# Patient Record
Sex: Female | Born: 1965 | Race: White | Hispanic: No | Smoking: Current every day smoker
Health system: Southern US, Community
[De-identification: ages and names within clinical notes are randomized; demographics above are authoritative.]

## PROBLEM LIST (undated history)

## (undated) DIAGNOSIS — R87629 Unspecified abnormal cytological findings in specimens from vagina: Secondary | ICD-10-CM

## (undated) DIAGNOSIS — M1990 Unspecified osteoarthritis, unspecified site: Secondary | ICD-10-CM

## (undated) DIAGNOSIS — T8859XA Other complications of anesthesia, initial encounter: Secondary | ICD-10-CM

## (undated) DIAGNOSIS — K219 Gastro-esophageal reflux disease without esophagitis: Secondary | ICD-10-CM

## (undated) HISTORY — DX: Unspecified abnormal cytological findings in specimens from vagina: R87.629

## (undated) HISTORY — PX: KNEE SURGERY: SHX244

## (undated) HISTORY — PX: CRYOTHERAPY: SHX1416

---

## 1999-12-19 ENCOUNTER — Encounter: Admission: RE | Admit: 1999-12-19 | Discharge: 2000-03-18 | Payer: Self-pay | Admitting: Obstetrics and Gynecology

## 2001-01-28 ENCOUNTER — Other Ambulatory Visit: Admission: RE | Admit: 2001-01-28 | Discharge: 2001-01-28 | Payer: Self-pay | Admitting: Obstetrics and Gynecology

## 2011-01-23 ENCOUNTER — Other Ambulatory Visit: Payer: Self-pay | Admitting: Obstetrics and Gynecology

## 2011-01-23 DIAGNOSIS — Z139 Encounter for screening, unspecified: Secondary | ICD-10-CM

## 2011-01-25 ENCOUNTER — Ambulatory Visit (HOSPITAL_COMMUNITY)
Admission: RE | Admit: 2011-01-25 | Discharge: 2011-01-25 | Disposition: A | Discharge status: Home / Self Care | Payer: Self-pay | Source: Ambulatory Visit | Attending: Obstetrics and Gynecology | Admitting: Obstetrics and Gynecology

## 2011-01-25 DIAGNOSIS — Z139 Encounter for screening, unspecified: Secondary | ICD-10-CM

## 2011-11-02 ENCOUNTER — Other Ambulatory Visit: Payer: Self-pay

## 2011-11-05 ENCOUNTER — Other Ambulatory Visit: Payer: Self-pay

## 2011-11-06 NOTE — Telephone Encounter (Signed)
Second denial. Not sure why we are getting request on this patient for Mobic. Please call pharmacy. I don't see where she is an active patient of ours anyway.

## 2011-11-08 NOTE — Telephone Encounter (Signed)
Ok I will call them. Thanks

## 2011-11-12 ENCOUNTER — Other Ambulatory Visit: Payer: Self-pay

## 2011-11-19 NOTE — Telephone Encounter (Signed)
Error

## 2012-07-25 ENCOUNTER — Emergency Department (HOSPITAL_COMMUNITY)
Admission: EM | Admit: 2012-07-25 | Discharge: 2012-07-25 | Disposition: A | Discharge status: Home / Self Care | Payer: Medicaid Other | Attending: Emergency Medicine | Admitting: Emergency Medicine

## 2012-07-25 ENCOUNTER — Encounter (HOSPITAL_COMMUNITY): Payer: Self-pay | Admitting: *Deleted

## 2012-07-25 ENCOUNTER — Encounter (HOSPITAL_COMMUNITY): Payer: Self-pay | Admitting: Anesthesiology

## 2012-07-25 ENCOUNTER — Encounter (HOSPITAL_COMMUNITY)
Admission: EM | Disposition: A | Discharge status: Home / Self Care | Payer: Self-pay | Source: Home / Self Care | Attending: Emergency Medicine

## 2012-07-25 ENCOUNTER — Emergency Department (HOSPITAL_COMMUNITY): Payer: Medicaid Other | Admitting: Anesthesiology

## 2012-07-25 ENCOUNTER — Emergency Department (HOSPITAL_COMMUNITY): Payer: Medicaid Other

## 2012-07-25 ENCOUNTER — Emergency Department (HOSPITAL_COMMUNITY)
Admission: EM | Admit: 2012-07-25 | Discharge: 2012-07-25 | Disposition: A | Discharge status: Home / Self Care | Payer: Medicaid Other | Source: Home / Self Care | Attending: Emergency Medicine | Admitting: Emergency Medicine

## 2012-07-25 DIAGNOSIS — Y9289 Other specified places as the place of occurrence of the external cause: Secondary | ICD-10-CM | POA: Insufficient documentation

## 2012-07-25 DIAGNOSIS — IMO0002 Reserved for concepts with insufficient information to code with codable children: Secondary | ICD-10-CM | POA: Insufficient documentation

## 2012-07-25 DIAGNOSIS — S62629B Displaced fracture of medial phalanx of unspecified finger, initial encounter for open fracture: Secondary | ICD-10-CM

## 2012-07-25 DIAGNOSIS — W3189XA Contact with other specified machinery, initial encounter: Secondary | ICD-10-CM | POA: Insufficient documentation

## 2012-07-25 DIAGNOSIS — W230XXA Caught, crushed, jammed, or pinched between moving objects, initial encounter: Secondary | ICD-10-CM | POA: Insufficient documentation

## 2012-07-25 DIAGNOSIS — Y9389 Activity, other specified: Secondary | ICD-10-CM | POA: Insufficient documentation

## 2012-07-25 DIAGNOSIS — S6990XA Unspecified injury of unspecified wrist, hand and finger(s), initial encounter: Secondary | ICD-10-CM

## 2012-07-25 DIAGNOSIS — J4489 Other specified chronic obstructive pulmonary disease: Secondary | ICD-10-CM | POA: Insufficient documentation

## 2012-07-25 DIAGNOSIS — Z9889 Other specified postprocedural states: Secondary | ICD-10-CM | POA: Insufficient documentation

## 2012-07-25 DIAGNOSIS — J449 Chronic obstructive pulmonary disease, unspecified: Secondary | ICD-10-CM | POA: Insufficient documentation

## 2012-07-25 DIAGNOSIS — F17200 Nicotine dependence, unspecified, uncomplicated: Secondary | ICD-10-CM | POA: Insufficient documentation

## 2012-07-25 HISTORY — PX: OPEN REDUCTION INTERNAL FIXATION (ORIF) METACARPAL: SHX6234

## 2012-07-25 LAB — CBC
HCT: 42.6 % (ref 36.0–46.0)
Hemoglobin: 14.7 g/dL (ref 12.0–15.0)
MCH: 31.6 pg (ref 26.0–34.0)
MCV: 91.6 fL (ref 78.0–100.0)
RBC: 4.65 MIL/uL (ref 3.87–5.11)

## 2012-07-25 LAB — BASIC METABOLIC PANEL
CO2: 24 mEq/L (ref 19–32)
Calcium: 9.4 mg/dL (ref 8.4–10.5)
Glucose, Bld: 97 mg/dL (ref 70–99)
Sodium: 137 mEq/L (ref 135–145)

## 2012-07-25 SURGERY — OPEN REDUCTION INTERNAL FIXATION (ORIF) METACARPAL
Anesthesia: General | Site: Finger | Laterality: Right | Wound class: Contaminated

## 2012-07-25 MED ORDER — SUCCINYLCHOLINE CHLORIDE 20 MG/ML IJ SOLN
INTRAMUSCULAR | Status: DC | PRN
  Administered 2012-07-25: 120 mg via INTRAVENOUS

## 2012-07-25 MED ORDER — DOCUSATE SODIUM 100 MG PO CAPS: 100.0000 mg | ORAL_CAPSULE | Freq: Two times a day (BID) | ORAL | Status: DC

## 2012-07-25 MED ORDER — BUPIVACAINE HCL (PF) 0.25 % IJ SOLN
INTRAMUSCULAR | Status: AC
  Filled 2012-07-25: qty 30

## 2012-07-25 MED ORDER — BUPIVACAINE HCL (PF) 0.25 % IJ SOLN
INTRAMUSCULAR | Status: DC | PRN
  Administered 2012-07-25: 8 mL

## 2012-07-25 MED ORDER — CHLORHEXIDINE GLUCONATE 4 % EX LIQD
60.0000 mL | Freq: Once | CUTANEOUS | Status: DC
  Filled 2012-07-25: qty 60

## 2012-07-25 MED ORDER — CEFAZOLIN SODIUM-DEXTROSE 2-3 GM-% IV SOLR
2.0000 g | INTRAVENOUS | Status: AC
  Administered 2012-07-25: 2 g via INTRAVENOUS
  Filled 2012-07-25: qty 50

## 2012-07-25 MED ORDER — KCL IN DEXTROSE-NACL 20-5-0.45 MEQ/L-%-% IV SOLN
INTRAVENOUS | Status: DC
  Administered 2012-07-25: 16:00:00 via INTRAVENOUS
  Filled 2012-07-25: qty 1000

## 2012-07-25 MED ORDER — ONDANSETRON HCL 4 MG/2ML IJ SOLN
INTRAMUSCULAR | Status: DC | PRN
  Administered 2012-07-25: 4 mg via INTRAVENOUS

## 2012-07-25 MED ORDER — LACTATED RINGERS IV SOLN
INTRAVENOUS | Status: DC | PRN
  Administered 2012-07-25 (×2): via INTRAVENOUS

## 2012-07-25 MED ORDER — OXYCODONE HCL 5 MG PO TABS: 5.0000 mg | ORAL_TABLET | Freq: Once | ORAL | Status: DC | PRN

## 2012-07-25 MED ORDER — MIDAZOLAM HCL 5 MG/5ML IJ SOLN
INTRAMUSCULAR | Status: DC | PRN
  Administered 2012-07-25 (×2): 1 mg via INTRAVENOUS

## 2012-07-25 MED ORDER — MORPHINE SULFATE 4 MG/ML IJ SOLN
4.0000 mg | Freq: Once | INTRAMUSCULAR | Status: DC
  Filled 2012-07-25: qty 1

## 2012-07-25 MED ORDER — LIDOCAINE HCL (CARDIAC) 20 MG/ML IV SOLN
INTRAVENOUS | Status: DC | PRN
  Administered 2012-07-25: 100 mg via INTRAVENOUS

## 2012-07-25 MED ORDER — CEFAZOLIN SODIUM 1-5 GM-% IV SOLN
INTRAVENOUS | Status: DC | PRN
  Administered 2012-07-25: 1 g via INTRAVENOUS

## 2012-07-25 MED ORDER — PROPOFOL 10 MG/ML IV BOLUS
INTRAVENOUS | Status: DC | PRN
  Administered 2012-07-25: 140 mg via INTRAVENOUS

## 2012-07-25 MED ORDER — OXYCODONE-ACETAMINOPHEN 5-325 MG PO TABS: 1.0000 | ORAL_TABLET | ORAL | Status: DC | PRN

## 2012-07-25 MED ORDER — 0.9 % SODIUM CHLORIDE (POUR BTL) OPTIME
TOPICAL | Status: DC | PRN
  Administered 2012-07-25: 1000 mL

## 2012-07-25 MED ORDER — FENTANYL CITRATE 0.05 MG/ML IJ SOLN
INTRAMUSCULAR | Status: DC | PRN
  Administered 2012-07-25 (×3): 50 ug via INTRAVENOUS
  Administered 2012-07-25: 150 ug via INTRAVENOUS
  Administered 2012-07-25: 100 ug via INTRAVENOUS

## 2012-07-25 MED ORDER — HYDROMORPHONE HCL PF 1 MG/ML IJ SOLN: 0.2500 mg | INTRAMUSCULAR | Status: DC | PRN

## 2012-07-25 MED ORDER — LIDOCAINE HCL (PF) 2 % IJ SOLN
INTRAMUSCULAR | Status: AC
  Administered 2012-07-25: 12:00:00
  Filled 2012-07-25: qty 10

## 2012-07-25 MED ORDER — PROMETHAZINE HCL 25 MG/ML IJ SOLN: 6.2500 mg | INTRAMUSCULAR | Status: DC | PRN

## 2012-07-25 MED ORDER — OXYCODONE HCL 5 MG/5ML PO SOLN: 5.0000 mg | Freq: Once | ORAL | Status: DC | PRN

## 2012-07-25 SURGICAL SUPPLY — 55 items
BANDAGE CONFORM 2  STR LF (GAUZE/BANDAGES/DRESSINGS) ×1 IMPLANT
BANDAGE ELASTIC 3 VELCRO ST LF (GAUZE/BANDAGES/DRESSINGS) ×2 IMPLANT
BANDAGE ELASTIC 4 VELCRO ST LF (GAUZE/BANDAGES/DRESSINGS) ×2 IMPLANT
BANDAGE GAUZE ELAST BULKY 4 IN (GAUZE/BANDAGES/DRESSINGS) ×2 IMPLANT
BLADE SURG ROTATE 9660 (MISCELLANEOUS) IMPLANT
BNDG CMPR 9X4 STRL LF SNTH (GAUZE/BANDAGES/DRESSINGS) ×1
BNDG CMPR MD 5X2 ELC HKLP STRL (GAUZE/BANDAGES/DRESSINGS) ×1
BNDG ELASTIC 2 VLCR STRL LF (GAUZE/BANDAGES/DRESSINGS) ×1 IMPLANT
BNDG ESMARK 4X9 LF (GAUZE/BANDAGES/DRESSINGS) ×2 IMPLANT
CLOTH BEACON ORANGE TIMEOUT ST (SAFETY) ×2 IMPLANT
CORDS BIPOLAR (ELECTRODE) ×2 IMPLANT
COVER SURGICAL LIGHT HANDLE (MISCELLANEOUS) ×2 IMPLANT
CUFF TOURNIQUET SINGLE 18IN (TOURNIQUET CUFF) ×2 IMPLANT
CUFF TOURNIQUET SINGLE 24IN (TOURNIQUET CUFF) IMPLANT
DRAIN TLS ROUND 10FR (DRAIN) IMPLANT
DRAPE OEC MINIVIEW 54X84 (DRAPES) ×2 IMPLANT
DRAPE SURG 17X11 SM STRL (DRAPES) ×2 IMPLANT
DRSG ADAPTIC 3X8 NADH LF (GAUZE/BANDAGES/DRESSINGS) ×2 IMPLANT
ELECT REM PT RETURN 9FT ADLT (ELECTROSURGICAL)
ELECTRODE REM PT RTRN 9FT ADLT (ELECTROSURGICAL) IMPLANT
GAUZE SPONGE 4X4 16PLY XRAY LF (GAUZE/BANDAGES/DRESSINGS) ×2 IMPLANT
GAUZE XEROFORM 1X8 LF (GAUZE/BANDAGES/DRESSINGS) ×1 IMPLANT
GLOVE BIOGEL PI IND STRL 8.5 (GLOVE) ×1 IMPLANT
GLOVE BIOGEL PI INDICATOR 8.5 (GLOVE) ×1
GLOVE SURG ORTHO 8.0 STRL STRW (GLOVE) ×2 IMPLANT
GOWN PREVENTION PLUS XLARGE (GOWN DISPOSABLE) ×2 IMPLANT
GOWN STRL NON-REIN LRG LVL3 (GOWN DISPOSABLE) ×6 IMPLANT
KIT BASIN OR (CUSTOM PROCEDURE TRAY) ×2 IMPLANT
KIT ROOM TURNOVER OR (KITS) ×2 IMPLANT
KWIRE 4.0 X .035IN (WIRE) ×2 IMPLANT
MANIFOLD NEPTUNE II (INSTRUMENTS) ×2 IMPLANT
NDL HYPO 25X1 1.5 SAFETY (NEEDLE) ×1 IMPLANT
NEEDLE HYPO 25X1 1.5 SAFETY (NEEDLE) ×2 IMPLANT
NS IRRIG 1000ML POUR BTL (IV SOLUTION) ×2 IMPLANT
PACK ORTHO EXTREMITY (CUSTOM PROCEDURE TRAY) ×2 IMPLANT
PAD ARMBOARD 7.5X6 YLW CONV (MISCELLANEOUS) ×4 IMPLANT
PAD CAST 4YDX4 CTTN HI CHSV (CAST SUPPLIES) ×1 IMPLANT
PADDING CAST COTTON 4X4 STRL (CAST SUPPLIES) ×2
PADDING UNDERCAST 2  STERILE (CAST SUPPLIES) ×1 IMPLANT
SOAP 2 % CHG 4 OZ (WOUND CARE) ×2 IMPLANT
SPONGE GAUZE 4X4 12PLY (GAUZE/BANDAGES/DRESSINGS) ×2 IMPLANT
STRIP CLOSURE SKIN 1/2X4 (GAUZE/BANDAGES/DRESSINGS) IMPLANT
SUCTION FRAZIER TIP 10 FR DISP (SUCTIONS) ×2 IMPLANT
SUT ETHILON 4 0 PS 2 18 (SUTURE) ×2 IMPLANT
SUT MNCRL AB 4-0 PS2 18 (SUTURE) IMPLANT
SUT PROLENE 5 0 PS 2 (SUTURE) ×1 IMPLANT
SUT VIC AB 2-0 FS1 27 (SUTURE) ×2 IMPLANT
SUT VICRYL 4-0 PS2 18IN ABS (SUTURE) ×2 IMPLANT
SYR CONTROL 10ML LL (SYRINGE) IMPLANT
SYSTEM CHEST DRAIN TLS 7FR (DRAIN) IMPLANT
TOWEL OR 17X24 6PK STRL BLUE (TOWEL DISPOSABLE) ×2 IMPLANT
TOWEL OR 17X26 10 PK STRL BLUE (TOWEL DISPOSABLE) ×2 IMPLANT
TUBE CONNECTING 12X1/4 (SUCTIONS) ×2 IMPLANT
WATER STERILE IRR 1000ML POUR (IV SOLUTION) ×2 IMPLANT
YANKAUER SUCT BULB TIP NO VENT (SUCTIONS) IMPLANT

## 2012-07-25 NOTE — Anesthesia Postprocedure Evaluation (Signed)
Anesthesia Post Note  Patient: Julia Bean  Procedure(s) Performed: Procedure(s) (LRB): OPEN REDUCTION INTERNAL FIXATION (ORIF) METACARPAL (Right)  Anesthesia type: general  Patient location: PACU  Post pain: Pain level controlled  Post assessment: Patient's Cardiovascular Status Stable  Last Vitals:  Filed Vitals:   07/25/12 2015  BP:   Pulse: 74  Temp:   Resp: 17    Post vital signs: Reviewed and stable  Level of consciousness: sedated  Complications: No apparent anesthesia complications

## 2012-07-25 NOTE — Anesthesia Preprocedure Evaluation (Addendum)
Anesthesia Evaluation  Patient identified by MRN, date of birth, ID band Patient awake    Reviewed: Allergy & Precautions, H&P , NPO status , Patient's Chart, lab work & pertinent test results  Airway Mallampati: I TM Distance: >3 FB Neck ROM: Full    Dental  (+) Teeth Intact and Dental Advisory Given   Pulmonary COPDCurrent Smoker,  + rhonchi         Cardiovascular Rhythm:Regular Rate:Normal     Neuro/Psych    GI/Hepatic   Endo/Other    Renal/GU      Musculoskeletal   Abdominal   Peds  Hematology   Anesthesia Other Findings   Reproductive/Obstetrics                         Anesthesia Physical Anesthesia Plan  ASA: II and emergent  Anesthesia Plan: General   Post-op Pain Management:    Induction: Intravenous, Rapid sequence and Cricoid pressure planned  Airway Management Planned: Oral ETT  Additional Equipment:   Intra-op Plan:   Post-operative Plan: Extubation in OR  Informed Consent: I have reviewed the patients History and Physical, chart, labs and discussed the procedure including the risks, benefits and alternatives for the proposed anesthesia with the patient or authorized representative who has indicated his/her understanding and acceptance.   Dental advisory given  Plan Discussed with: CRNA and Surgeon  Anesthesia Plan Comments:       Anesthesia Quick Evaluation

## 2012-07-25 NOTE — Brief Op Note (Signed)
07/25/2012  5:58 PM  PATIENT:  Bartholomew Crews  46 y.o. female  PRE-OPERATIVE DIAGNOSIS:  Right Ring Finger Fracture  POST-OPERATIVE DIAGNOSIS:  RIGHT RING FINGER FRACTURE  PROCEDURE:  orif right ring finger middle phalanx Debridement right ring finger  SURGEON:  Surgeon(s) and Role:    * [name], MD - Primary  PHYSICIAN ASSISTANT: NONE ASSISTANTS: none   ANESTHESIA:   general  EBL:   none  BLOOD ADMINISTERED:none  DRAINS: none   LOCAL MEDICATIONS USED:  MARCAINE     SPECIMEN:  No Specimen  DISPOSITION OF SPECIMEN:  N/A  COUNTS:  YES  TOURNIQUET:  * Missing tourniquet times found for documented tourniquets in log:  16109 *  DICTATION: .[id number]  PLAN OF CARE: Discharge to home after PACU  PATIENT DISPOSITION:  PACU - hemodynamically stable.   Delay start of Pharmacological VTE agent (>24hrs) due to surgical blood loss or risk of bleeding: not applicable

## 2012-07-25 NOTE — ED Notes (Addendum)
Laceration to rtring finger, hand was smashed with a piece of wood while using a wood splitter.  Pain also to rt 5th digit.  Ice pack applied

## 2012-07-25 NOTE — ED Notes (Signed)
Received call from OR - hand surgeon is on his way from his office to see pt.  Pt informed of this , incentive spirometer teaching performed, pt   awaiting to use bathroom .

## 2012-07-25 NOTE — H&P (Addendum)
Julia Bean is an 46 y.o. female.   Chief Complaint: RIGHT ring finger injury HPI: PT SEEN AND EXAMINED AT [place], PT WITH OPEN LEFT RING FINGER FRACTURE FROM LOG SPLITTER PT TRANSFERRED TO [facility] FOR DEFINITIVE CARE PT WITH NO PRIOR INJURY TO RING FINGER PT C/O PAIN TO RIGHT RING FINGER HAS NOT RECEIVED PAIN MEDICATION  History reviewed. No pertinent past medical history.  Past Surgical History  Procedure Date  . Knee surgery   . Cesarean section     History reviewed. No pertinent family history. Social History:  reports that she has been smoking.  She does not have any smokeless tobacco history on file. She reports that she drinks alcohol. She reports that she does not use illicit drugs.  Allergies: No Known Allergies   (Not in a hospital admission)  Results for orders placed during the hospital encounter of 07/25/12 (from the past 48 hour(s))  CBC     Status: Abnormal   Collection Time   07/25/12  2:57 PM      Component Value Range Comment   WBC 11.0 (*) 4.0 - 10.5 K/uL    RBC 4.65  3.87 - 5.11 MIL/uL    Hemoglobin 14.7  12.0 - 15.0 g/dL    HCT 19.1  47.8 - 29.5 %    MCV 91.6  78.0 - 100.0 fL    MCH 31.6  26.0 - 34.0 pg    MCHC 34.5  30.0 - 36.0 g/dL    RDW 62.1  30.8 - 65.7 %    Platelets 219  150 - 400 K/uL   BASIC METABOLIC PANEL     Status: Normal   Collection Time   07/25/12  2:57 PM      Component Value Range Comment   Sodium 137  135 - 145 mEq/L    Potassium 3.6  3.5 - 5.1 mEq/L    Chloride 101  96 - 112 mEq/L    CO2 24  19 - 32 mEq/L    Glucose, Bld 97  70 - 99 mg/dL    BUN 12  6 - 23 mg/dL    Creatinine, Ser 8.46  0.50 - 1.10 mg/dL    Calcium 9.4  8.4 - 96.2 mg/dL    GFR calc non Af Amer >90  >90 mL/min    GFR calc Af Amer >90  >90 mL/min    Dg Hand Complete Right  07/25/2012  *RADIOLOGY REPORT*  Clinical Data: Finger versus wood splinter  RIGHT HAND - COMPLETE 3+ VIEW  Comparison: None.  Findings: Transverse fracture across the middle phalanx  right ring finger, distracted 2-3 mm.  There is gas in the fracture and adjacent subcutaneous tissues indicating open fracture.  There is dorsal angulation of the distal fracture fragment.  No other bony abnormality is evident.  IMPRESSION:  1.  Open fracture, middle phalanx right ring finger   Original Report Authenticated By: D. Andria Rhein, MD     NO RECENT ILLNESSES  OR HOSPITALIZATIONS  Blood pressure 116/62, pulse 80, temperature 98.2 F (36.8 C), temperature source Oral, resp. rate 18, last menstrual period 07/18/2012, SpO2 98.00%.  General Appearance:  Alert, cooperative, no distress, appears stated age  Head:  Normocephalic, without obvious abnormality, atraumatic  Eyes:  Pupils equal, conjunctiva/corneas clear,         Throat: Lips, mucosa, and tongue normal; teeth and gums normal  Neck: No visible masses     Lungs:   respirations unlabored  Chest Wall:  No tenderness or deformity  Heart:  Regular rate and rhythm,  Abdomen:   Soft, non-tender,         Extremities: RIGHT RING FINGER PERFUSED, LIMITED MOBILITY ABLE TO GENTLY WIGGLE FINGER OBVIOUS DEFORMITY TO FINGER NO INJURY TO INDEX/LONG GOOD THUMB MOBILITY  Pulses: 2+ and symmetric  Skin: Skin color, texture, turgor normal, no rashes or lesions     Neurologic: Normal    Assessment/Plan RIGHT RING FINGER OPEN MIDDLE PHALANX FRACTURE  RIGHT RING FINGER OPEN REDUCTION AND INTERNAL FIXATION, IRRIGATION AND DEBRIDEMENT, REPAIR AS INDICATED  PT SEEN AND EVALUATED IN ED PT WITH OPEN PHALANGEAL FRACTURE THAT IS UNSTABLE WILL PLAN FOR OPERATIVE DEBRIDEMENT AND FIXATION  R/B/A DISCUSSED WITH PT IN ED.  PT VOICED UNDERSTANDING OF PLAN CONSENT SIGNED DAY OF SURGERY PT SEEN AND EXAMINED PRIOR TO OPERATIVE PROCEDURE/DAY OF SURGERY SITE MARKED. QUESTIONS ANSWERED WILL GO HOME FOLLOWING SURGERY  [name] 07/25/2012, 5:52 PM

## 2012-07-25 NOTE — ED Notes (Addendum)
IV line flushing with NS at this time 

## 2012-07-25 NOTE — ED Notes (Signed)
Explanation given to pt and friend at bedside that the Hand surgeon may not be in until after office hours. Pt verbalized displeasure but is understanding of situation. Declined morphine and informed on inability to administer po meds or any kind of anti-inflammatory meds via IV.

## 2012-07-25 NOTE — Transfer of Care (Signed)
Immediate Anesthesia Transfer of Care Note  Patient: Julia Bean  Procedure(s) Performed: Procedure(s) (LRB) with comments: OPEN REDUCTION INTERNAL FIXATION (ORIF) METACARPAL (Right) - Open Reduction Internal Fixation Right Third Finger  Patient Location: PACU  Anesthesia Type:General  Level of Consciousness: awake and alert   Airway & Oxygen Therapy: Patient Spontanous Breathing  Post-op Assessment: Report given to PACU RN and Post -op Vital signs reviewed and stable  Post vital signs: Reviewed and stable  Complications: No apparent anesthesia complications

## 2012-07-25 NOTE — ED Notes (Signed)
Pt has hand injury due to see Dr. Orlan Leavens.  Pt A/O x 4.  NAD.  Pt has lac to ring finger and pinky finger-reports that her ring finger is broken.  Pt from [facility].

## 2012-07-25 NOTE — ED Notes (Signed)
Spoke with Dr Orlan Leavens.

## 2012-07-25 NOTE — ED Provider Notes (Signed)
History     CSN: [id number]  Arrival date & time 07/25/12  1036   First MD Initiated Contact with Patient 07/25/12 1044      Chief Complaint  Patient presents with  . Finger Injury    (Consider location/radiation/quality/duration/timing/severity/associated sxs/prior treatment) HPI Comments: Julia Bean presents with a crush injury to her 4th and 5th fingers of her right hand after catching them in between the splitter and a log while using a log splitter just prior to arrival.  She does have sensation in the fingers, along with significant swelling,  Tenderness and small amount of bleeding from the lacerations present.  She has taken no medications prior to arrival and no treatment other than direct pressure to the wound.  Her last tetanus was less than 5 years ago.  She is right handed and has no other significant medical history.    The history is provided by the patient and a friend.    History reviewed. No pertinent past medical history.  Past Surgical History  Procedure Date  . Knee surgery   . Cesarean section     No family history on file.  History  Substance Use Topics  . Smoking status: Current Every Day Smoker  . Smokeless tobacco: Not on file  . Alcohol Use: Yes     Comment: Occ    OB History    Grav Para Term Preterm Abortions TAB SAB Ect Mult Living                  Review of Systems  Musculoskeletal: Positive for joint swelling and arthralgias.  Skin: Positive for wound.  Neurological: Negative for weakness and numbness.    Allergies  Review of patient's allergies indicates no known allergies.  Home Medications   Current Outpatient Rx  Name  Route  Sig  Dispense  Refill  . THERA M PLUS PO TABS   Oral   Take 1 tablet by mouth daily.         Marland Kitchen DOCUSATE SODIUM 100 MG PO CAPS   Oral   Take 1 capsule (100 mg total) by mouth 2 (two) times daily.   30 capsule   0   . OXYCODONE-ACETAMINOPHEN 5-325 MG PO TABS   Oral   Take 1 tablet by  mouth every 4 (four) hours as needed for pain.   45 tablet   0     BP 98/55  Pulse 65  Temp 97.7 F (36.5 C) (Oral)  Resp 16  SpO2 100%  LMP 07/18/2012  Physical Exam  Constitutional: She appears well-developed and well-nourished.  HENT:  Head: Atraumatic.  Neck: Neck supple.  Cardiovascular: Normal rate.        Pulses equal bilaterally  Pulmonary/Chest: Effort normal.  Musculoskeletal: She exhibits edema and tenderness.       Right hand: She exhibits decreased range of motion, tenderness, deformity, laceration and swelling. She exhibits normal capillary refill. normal sensation noted.       Finger strength not assessed secondary to pain and obvious deformity.  She has 2 lacerations, one along vertical length of distal and middle phalanx,  Second on 4th finger tuft,  Hemostatic.  Moderate edema and ecchymosis noted primarily of 4th finger.  She can wiggle the finger tip,  With discomfort.    Neurological: She is alert. She has normal strength. She displays normal reflexes. No sensory deficit.       Equal strength  Skin: Skin is warm and dry.  Psychiatric: She  has a normal mood and affect.    ED Course  NERVE BLOCK Date/Time: 07/25/2012 11:40 AM Performed by: Burgess Amor Authorized by: Burgess Amor Consent: Verbal consent obtained. Risks and benefits: risks, benefits and alternatives were discussed Consent given by: patient Patient understanding: patient states understanding of the procedure being performed Patient identity confirmed: verbally with patient Time out: Immediately prior to procedure a "time out" was called to verify the correct patient, procedure, equipment, support staff and site/side marked as required. Indications: pain relief and fracture Body area: upper extremity Nerve: digital Laterality: right Preparation: Patient was prepped and draped in the usual sterile fashion. Patient position: supine Needle gauge: 25 G Location technique: anatomical  landmarks Local anesthetic: lidocaine 2% without epinephrine Anesthetic total: 2 ml Outcome: pain improved Patient tolerance: Patient tolerated the procedure well with no immediate complications. Comments: Additionally,  Ring was cut from this finger carefully, using manual ring cutters.   (including critical care time)  Labs Reviewed - No data to display Dg Hand Complete Right  07/25/2012  *RADIOLOGY REPORT*  Clinical Data: Finger versus wood splinter  RIGHT HAND - COMPLETE 3+ VIEW  Comparison: None.  Findings: Transverse fracture across the middle phalanx right ring finger, distracted 2-3 mm.  There is gas in the fracture and adjacent subcutaneous tissues indicating open fracture.  There is dorsal angulation of the distal fracture fragment.  No other bony abnormality is evident.  IMPRESSION:  1.  Open fracture, middle phalanx right ring finger   Original Report Authenticated By: D. Andria Rhein, MD      1. Fracture of finger, middle phalanx, right, open       MDM  Call placed to Dr Romeo Apple who defers pt to hand specialist.  Call placed to Dr. Melvyn Novas who will see this pt at [facility] ed.  Pt's finger was dressed in 4 x 4's moistened with NS,  Cling.  Advised npo.  Friend at bedside will transport pt directly to [place].  I contacted [facility] triage and informed of patients pending arrival.    Patients labs and/or radiological studies were reviewed during the medical decision making and disposition process.     Burgess Amor, [place] 07/25/12 2315

## 2012-07-25 NOTE — Progress Notes (Signed)
Patient discharged to home with family.  Discharge teaching completed with husband, patient and children.  IV discontinued (catheter intact) before patient was discharged

## 2012-07-25 NOTE — Preoperative (Signed)
Beta Blockers   Reason not to administer Beta Blockers:Not Applicable 

## 2012-07-25 NOTE — ED Notes (Addendum)
Lac to rt ring finger , has ring on finger that pt will not let me try to remove.  Ring is loose at present. Pain 5 th finger also

## 2012-07-25 NOTE — ED Notes (Signed)
Dr Orlan Leavens in room with pt , Consent signed. Instructed to sent pt directly to OR ,

## 2012-07-26 NOTE — Op Note (Signed)
Julia Bean              ACCOUNT NO.:  [id number]  MEDICAL RECORD NO.:  [id number]  LOCATION:  [facility]                         FACILITY:  [facility]  PHYSICIAN:  Julia Done, MD  DATE OF BIRTH:  [birth year]  DATE OF PROCEDURE:  07/25/2012 DATE OF DISCHARGE:  07/25/2012                              OPERATIVE REPORT   PREOPERATIVE DIAGNOSIS:  Right ring finger displaced middle phalanx fracture, crush injury.  POSTOPERATIVE DIAGNOSIS:  Right ring finger displaced middle phalanx fracture, crush injury.  ATTENDING PHYSICIAN:  Julia Done, MD who scrubbed and present for the entire procedure.  ASSISTANT SURGEON:  None.  ANESTHESIA:  General via endotracheal tube.  OPERATION: 1. Debridement of skin and subcutaneous tissue associated with open     fracture, right ring finger. 2. Percutaneous skeletal fixation for unstable phalangeal shaft     fracture, right ring finger, middle phalanx. 3. Radiographs 3 views, right ring finger.  SURGICAL IMPLANTS:  Two 0.035 K-wires.  SURGICAL INDICATIONS:  Julia Bean is a right-hand-dominant female who sustained a crushing injury to her right ring finger.  The patient presented to the ER and was recommended to undergo the above procedure. Risks, benefits, and alternative were discussed in detail with the patient and signed informed consent was obtained.  Risks include, but not limited to bleeding, infection, damage to nearby nerves, arteries, or tendons, loss of motion of wrist and digits, incomplete relief of symptoms, and need for further surgical intervention.  DESCRIPTION OF PROCEDURE:  The patient was properly identified in the preop holding area and mark with a permanent marker made on the right ring finger to indicate correct operative site.  The patient was then brought back to the operating room, placed supine on the anesthesia room table.  General anesthesia was administered.  The patient received preop antibiotics  prior to skin incision.  A well-padded tourniquet was then placed on the right brachium and sealed with 1000 drape.  The right upper extremity was then prepped and draped in normal sterile fashion. Time-out was called, correct side was identified, and procedure then begun.  Attention was then turned to the right ring finger where the injury was more at the distal pulp over the distal phalanx.  Debridement of the skin, subcutaneous tissue was then carried out.  Excisional debridement was then carried out.  The patient did have a near circumferential laceration over the distal phalanx.  She did have a very small, less than 0.5 mm focal over the dorsal aspect of the middle phalanx.  Following this, debridement of skin and subcutaneous tissue was then carried out sharply with a sharp knife and scissors.  After excisional debridement, the wound was then thoroughly irrigated. Following this, given the soft tissue injury we tried to not violate the extensor mechanism.  Percutaneous skeletal fixation was then carried out on the middle phalanx fracture.  Two 0.035 K-wires were then placed across the fracture site keeping the finger in good alignment with good cortical contact.  There was still slight very mild dorsal displacement after the pinning.  K-wires were felt to be in good position, cut and then bent left out of the skin.  Pin sites  were then irrigated. Xeroform bolster dressing was applied around the pin sites.  Copious wound irrigation Bean throughout.  The distal laceration was then closed with simple Prolene sutures.  Final radiographs were then obtained.  10 mL of 0.25% Marcaine infiltrated.  Adaptic dressing and sterile compressive bandage then applied.  The patient was then placed in a well- padded ulnar gutter splint, extubated, and taken to the recovery room in good condition.  POSTPROCEDURE PLAN:  The patient will be discharged to home, seen back in the office in approximately 1  week for wound check, and then x-rays, application of a short-arm ulnar gutter cast for a total of 3-1/2 weeks until the pins out around the 3-1/2 to 4-week mark and then begin an outpatient therapy regimen.  Radiographs at each visit.  INTRAOPERATIVE RADIOGRAPHS:  Three views of the finger do show the internal fixation in place in good position.     Julia Done, MD     FWO/MEDQ  D:  07/25/2012  T:  07/26/2012  Job:  [id number]

## 2012-07-27 NOTE — ED Provider Notes (Signed)
Medical screening examination/treatment/procedure(s) were conducted as a shared visit with non-physician practitioner(Julie Idol) and myself.  I personally evaluated the patient during the encounter.  The patient presents after injuring finger with a log splitter.    On exam, the vitals are stable and she is afebrile.  There is swelling of the middle phalanx of the right ring finger with lacerations to either side.    She has a mid-shaft middle phalanx fracture with lacerations overlying the fracture site.  Julia Bean has spoken with hand surgery and she will be sent [facility] ED where she will be evaluated.  A digital block has been performed.  Geoffery Lyons, MD 07/27/12 [phone]

## 2012-07-28 ENCOUNTER — Encounter (HOSPITAL_COMMUNITY): Payer: Self-pay | Admitting: Orthopedic Surgery

## 2012-08-11 NOTE — ED Provider Notes (Signed)
History     CSN: [id number]  Arrival date & time 07/25/12  1355   First MD Initiated Contact with Patient 07/25/12 1442      Chief Complaint  Patient presents with  . Hand Injury    (Consider location/radiation/quality/duration/timing/severity/associated sxs/prior treatment) Patient is a 47 y.o. female presenting with hand injury. The history is provided by the patient.  Hand Injury  The incident occurred 3 to 5 hours ago. Injury mechanism: Caught fingers in equipment while splitting logs.  The pain is present in the right fingers. The pain is mild. Pertinent negatives include no fever. Associated symptoms comments: She was seen at [facility] and referred to Dr. Melvyn Novas here for surgery. Marland Kitchen    History reviewed. No pertinent past medical history.  Past Surgical History  Procedure Date  . Knee surgery   . Cesarean section   . Open reduction internal fixation (orif) metacarpal 07/25/2012    Procedure: OPEN REDUCTION INTERNAL FIXATION (ORIF) METACARPAL;  Surgeon: [name], MD;  Location: [facility] OR;  Service: Orthopedics;  Laterality: Right;  Open Reduction Internal Fixation Right Third Finger    History reviewed. No pertinent family history.  History  Substance Use Topics  . Smoking status: Current Every Day Smoker  . Smokeless tobacco: Not on file  . Alcohol Use: Yes     Comment: Occ    OB History    Grav Para Term Preterm Abortions TAB SAB Ect Mult Living                  Review of Systems  Constitutional: Negative for fever and chills.  Musculoskeletal:       See HPI.  Skin: Positive for wound.  Neurological: Negative.  Negative for numbness.    Allergies  Review of patient's allergies indicates no known allergies.  Home Medications   Current Outpatient Rx  Name  Route  Sig  Dispense  Refill  . THERA M PLUS PO TABS   Oral   Take 1 tablet by mouth daily.         Marland Kitchen DOCUSATE SODIUM 100 MG PO CAPS   Oral   Take 1 capsule (100 mg total) by  mouth 2 (two) times daily.   30 capsule   0   . OXYCODONE-ACETAMINOPHEN 5-325 MG PO TABS   Oral   Take 1 tablet by mouth every 4 (four) hours as needed for pain.   45 tablet   0     BP 118/69  Pulse 71  Temp 97.8 F (36.6 C) (Oral)  Resp 14  SpO2 97%  LMP 07/18/2012  Physical Exam  Constitutional: She is oriented to person, place, and time. She appears well-developed and well-nourished. No distress.  HENT:  Head: Atraumatic.  Neck: Normal range of motion.  Pulmonary/Chest: Effort normal.  Abdominal: Soft. There is no tenderness.  Musculoskeletal: She exhibits tenderness.       Injury to right 4th and 5th fingers with deep laceration 4th finger. Limited range of motion, improved ROM of 5th digit. FROM 1st, 2nd, 3rd digits.  Neurological: She is alert and oriented to person, place, and time.       Neurosensory exam to light touch distal to wound intact without deficit.  Skin:       See HPI.  Psychiatric: She has a normal mood and affect.    ED Course  Procedures (including critical care time)  Labs Reviewed  CBC - Abnormal; Notable for the following:    WBC  11.0 (*)     All other components within normal limits  BASIC METABOLIC PANEL  LAB REPORT - SCANNED   No results found.   1. Finger injury       MDM  Dr. Melvyn Novas paged and is aware patient has arrived in ED.         Arnoldo Hooker, PA-C 08/11/12 1422

## 2012-08-16 NOTE — ED Provider Notes (Signed)
Medical screening examination/treatment/procedure(s) were performed by non-physician practitioner and as supervising physician I was immediately available for consultation/collaboration.   Loren Racer, MD 08/16/12 [phone]

## 2012-09-03 ENCOUNTER — Encounter (HOSPITAL_COMMUNITY): Payer: Self-pay | Admitting: Pharmacy Technician

## 2012-09-03 ENCOUNTER — Encounter (HOSPITAL_COMMUNITY): Payer: Self-pay | Admitting: *Deleted

## 2012-09-03 MED ORDER — MIDAZOLAM HCL 2 MG/2ML IJ SOLN: 1.0000 mg | INTRAMUSCULAR | Status: DC | PRN

## 2012-09-03 MED ORDER — FENTANYL CITRATE 0.05 MG/ML IJ SOLN: 50.0000 ug | Freq: Once | INTRAMUSCULAR | Status: DC

## 2012-09-03 NOTE — Progress Notes (Addendum)
Pt was not at home and not expected in until after 1900.  Pts husband took the instructions and answered health care questions.  I instructed him to tell her " Nothing to eat or drink after midnight.  Mr Tapia said, "I'll leave that up to her, I dont think she is having surgery until 4:30 pm, she should be able to eat breakfast". I instructed Mr lemon to call MD and see if she can have breakfast.  "I'll tell her, but it is up to her."

## 2012-09-04 ENCOUNTER — Encounter (HOSPITAL_COMMUNITY): Payer: Self-pay | Admitting: *Deleted

## 2012-09-04 ENCOUNTER — Encounter (HOSPITAL_COMMUNITY): Payer: Self-pay | Admitting: Anesthesiology

## 2012-09-04 ENCOUNTER — Ambulatory Visit (HOSPITAL_COMMUNITY): Payer: Medicaid Other | Admitting: Anesthesiology

## 2012-09-04 ENCOUNTER — Ambulatory Visit (HOSPITAL_COMMUNITY)
Admission: RE | Admit: 2012-09-04 | Discharge: 2012-09-04 | Disposition: A | Discharge status: Home / Self Care | Payer: Medicaid Other | Source: Ambulatory Visit | Attending: Orthopedic Surgery | Admitting: Orthopedic Surgery

## 2012-09-04 ENCOUNTER — Encounter (HOSPITAL_COMMUNITY)
Admission: RE | Disposition: A | Discharge status: Home / Self Care | Payer: Self-pay | Source: Ambulatory Visit | Attending: Orthopedic Surgery

## 2012-09-04 DIAGNOSIS — IMO0002 Reserved for concepts with insufficient information to code with codable children: Secondary | ICD-10-CM | POA: Insufficient documentation

## 2012-09-04 HISTORY — DX: Gastro-esophageal reflux disease without esophagitis: K21.9

## 2012-09-04 HISTORY — PX: OPEN REDUCTION INTERNAL FIXATION (ORIF) METACARPAL: SHX6234

## 2012-09-04 LAB — SURGICAL PCR SCREEN: MRSA, PCR: NEGATIVE

## 2012-09-04 LAB — CBC
MCH: 32.5 pg (ref 26.0–34.0)
MCHC: 35.6 g/dL (ref 30.0–36.0)
Platelets: 249 10*3/uL (ref 150–400)
RBC: 4.64 MIL/uL (ref 3.87–5.11)
RDW: 12.7 % (ref 11.5–15.5)

## 2012-09-04 LAB — HCG, SERUM, QUALITATIVE: Preg, Serum: NEGATIVE

## 2012-09-04 SURGERY — OPEN REDUCTION INTERNAL FIXATION (ORIF) METACARPAL
Anesthesia: General | Site: Finger | Laterality: Right | Wound class: Dirty or Infected

## 2012-09-04 MED ORDER — BUPIVACAINE HCL (PF) 0.25 % IJ SOLN
INTRAMUSCULAR | Status: DC | PRN
  Administered 2012-09-04: 7 mL

## 2012-09-04 MED ORDER — 0.9 % SODIUM CHLORIDE (POUR BTL) OPTIME
TOPICAL | Status: DC | PRN
  Administered 2012-09-04: 1000 mL

## 2012-09-04 MED ORDER — DOCUSATE SODIUM 100 MG PO CAPS: 100.0000 mg | ORAL_CAPSULE | Freq: Two times a day (BID) | ORAL | Status: DC

## 2012-09-04 MED ORDER — DEXAMETHASONE SODIUM PHOSPHATE 4 MG/ML IJ SOLN
INTRAMUSCULAR | Status: DC | PRN
  Administered 2012-09-04: 4 mg via INTRAVENOUS

## 2012-09-04 MED ORDER — OXYCODONE-ACETAMINOPHEN 5-325 MG PO TABS
2.0000 | ORAL_TABLET | Freq: Once | ORAL | Status: AC
  Administered 2012-09-04: 2 via ORAL

## 2012-09-04 MED ORDER — CEFAZOLIN SODIUM-DEXTROSE 2-3 GM-% IV SOLR
INTRAVENOUS | Status: AC
  Administered 2012-09-04: 2 g via INTRAVENOUS
  Filled 2012-09-04: qty 50

## 2012-09-04 MED ORDER — OXYCODONE-ACETAMINOPHEN 5-325 MG PO TABS
ORAL_TABLET | ORAL | Status: AC
  Filled 2012-09-04: qty 2

## 2012-09-04 MED ORDER — CEFAZOLIN SODIUM-DEXTROSE 2-3 GM-% IV SOLR: 2.0000 g | INTRAVENOUS | Status: DC

## 2012-09-04 MED ORDER — FENTANYL CITRATE 0.05 MG/ML IJ SOLN
INTRAMUSCULAR | Status: AC
  Filled 2012-09-04: qty 2

## 2012-09-04 MED ORDER — FENTANYL CITRATE 0.05 MG/ML IJ SOLN
INTRAMUSCULAR | Status: DC | PRN
  Administered 2012-09-04 (×2): 50 ug via INTRAVENOUS
  Administered 2012-09-04: 100 ug via INTRAVENOUS
  Administered 2012-09-04: 50 ug via INTRAVENOUS

## 2012-09-04 MED ORDER — HYDROMORPHONE HCL PF 1 MG/ML IJ SOLN: 0.2500 mg | INTRAMUSCULAR | Status: DC | PRN

## 2012-09-04 MED ORDER — MIDAZOLAM HCL 2 MG/2ML IJ SOLN: 1.0000 mg | INTRAMUSCULAR | Status: DC | PRN

## 2012-09-04 MED ORDER — LIDOCAINE HCL 1 % IJ SOLN
INTRAMUSCULAR | Status: DC | PRN
  Administered 2012-09-04: 80 mg via INTRADERMAL

## 2012-09-04 MED ORDER — OXYCODONE HCL 5 MG PO TABS
5.0000 mg | ORAL_TABLET | Freq: Once | ORAL | Status: AC | PRN
Start: 2012-09-04 — End: 2012-09-04
  Administered 2012-09-04: 5 mg via ORAL

## 2012-09-04 MED ORDER — LACTATED RINGERS IV SOLN
INTRAVENOUS | Status: DC
  Administered 2012-09-04: 16:00:00 via INTRAVENOUS

## 2012-09-04 MED ORDER — BUPIVACAINE HCL (PF) 0.25 % IJ SOLN
INTRAMUSCULAR | Status: AC
  Filled 2012-09-04: qty 30

## 2012-09-04 MED ORDER — OXYCODONE HCL 5 MG/5ML PO SOLN: 5.0000 mg | Freq: Once | ORAL | Status: AC | PRN

## 2012-09-04 MED ORDER — ONDANSETRON HCL 4 MG/2ML IJ SOLN
INTRAMUSCULAR | Status: DC | PRN
  Administered 2012-09-04: 4 mg via INTRAVENOUS

## 2012-09-04 MED ORDER — PROPOFOL 10 MG/ML IV BOLUS
INTRAVENOUS | Status: DC | PRN
  Administered 2012-09-04: 180 mg via INTRAVENOUS

## 2012-09-04 MED ORDER — MUPIROCIN 2 % EX OINT
TOPICAL_OINTMENT | Freq: Once | CUTANEOUS | Status: AC
  Administered 2012-09-04: 1 via NASAL

## 2012-09-04 MED ORDER — PROMETHAZINE HCL 25 MG/ML IJ SOLN: 6.2500 mg | INTRAMUSCULAR | Status: DC | PRN

## 2012-09-04 MED ORDER — OXYCODONE HCL 5 MG PO TABS
ORAL_TABLET | ORAL | Status: AC
  Filled 2012-09-04: qty 1

## 2012-09-04 MED ORDER — MUPIROCIN 2 % EX OINT
TOPICAL_OINTMENT | CUTANEOUS | Status: AC
  Filled 2012-09-04: qty 22

## 2012-09-04 MED ORDER — MIDAZOLAM HCL 5 MG/5ML IJ SOLN
INTRAMUSCULAR | Status: DC | PRN
  Administered 2012-09-04: 2 mg via INTRAVENOUS

## 2012-09-04 MED ORDER — CHLORHEXIDINE GLUCONATE 4 % EX LIQD: 60.0000 mL | Freq: Once | CUTANEOUS | Status: DC

## 2012-09-04 MED ORDER — OXYCODONE-ACETAMINOPHEN 5-325 MG PO TABS: 1.0000 | ORAL_TABLET | ORAL | Status: DC | PRN

## 2012-09-04 MED ORDER — FENTANYL CITRATE 0.05 MG/ML IJ SOLN
50.0000 ug | Freq: Once | INTRAMUSCULAR | Status: AC
  Administered 2012-09-04: 50 ug via INTRAVENOUS

## 2012-09-04 SURGICAL SUPPLY — 64 items
BANDAGE ELASTIC 3 VELCRO ST LF (GAUZE/BANDAGES/DRESSINGS) ×1 IMPLANT
BANDAGE ELASTIC 4 VELCRO ST LF (GAUZE/BANDAGES/DRESSINGS) ×1 IMPLANT
BANDAGE GAUZE 4  KLING STR (GAUZE/BANDAGES/DRESSINGS) ×1 IMPLANT
BANDAGE GAUZE ELAST BULKY 4 IN (GAUZE/BANDAGES/DRESSINGS) ×1 IMPLANT
BIT DRILL 1.1 (BIT) ×2
BIT DRILL 60X20X1.1XQC TMX (BIT) IMPLANT
BIT DRL 60X20X1.1XQC TMX (BIT) ×1
BLADE SURG ROTATE 9660 (MISCELLANEOUS) IMPLANT
BNDG CMPR 9X4 STRL LF SNTH (GAUZE/BANDAGES/DRESSINGS) ×1
BNDG COHESIVE 1X5 TAN STRL LF (GAUZE/BANDAGES/DRESSINGS) ×1 IMPLANT
BNDG ESMARK 4X9 LF (GAUZE/BANDAGES/DRESSINGS) ×2 IMPLANT
CLOTH BEACON ORANGE TIMEOUT ST (SAFETY) ×2 IMPLANT
CORDS BIPOLAR (ELECTRODE) ×2 IMPLANT
COVER SURGICAL LIGHT HANDLE (MISCELLANEOUS) ×2 IMPLANT
CUFF TOURNIQUET SINGLE 18IN (TOURNIQUET CUFF) ×2 IMPLANT
CUFF TOURNIQUET SINGLE 24IN (TOURNIQUET CUFF) IMPLANT
DRAIN TLS ROUND 10FR (DRAIN) IMPLANT
DRAPE OEC MINIVIEW 54X84 (DRAPES) ×2 IMPLANT
DRAPE SURG 17X11 SM STRL (DRAPES) ×2 IMPLANT
DRIVER BIT 1.5 (TRAUMA) ×1 IMPLANT
DRSG ADAPTIC 3X8 NADH LF (GAUZE/BANDAGES/DRESSINGS) ×1 IMPLANT
ELECT REM PT RETURN 9FT ADLT (ELECTROSURGICAL) ×2
ELECTRODE REM PT RTRN 9FT ADLT (ELECTROSURGICAL) IMPLANT
GAUZE SPONGE 4X4 16PLY XRAY LF (GAUZE/BANDAGES/DRESSINGS) ×1 IMPLANT
GLOVE BIOGEL PI IND STRL 8 (GLOVE) IMPLANT
GLOVE BIOGEL PI IND STRL 8.5 (GLOVE) ×1 IMPLANT
GLOVE BIOGEL PI INDICATOR 8 (GLOVE) ×1
GLOVE BIOGEL PI INDICATOR 8.5 (GLOVE) ×1
GLOVE SURG ORTHO 8.0 STRL STRW (GLOVE) ×2 IMPLANT
GLOVE SURG SS PI 8.0 STRL IVOR (GLOVE) ×1 IMPLANT
GOWN PREVENTION PLUS XLARGE (GOWN DISPOSABLE) ×3 IMPLANT
GOWN STRL NON-REIN LRG LVL3 (GOWN DISPOSABLE) ×3 IMPLANT
KIT BASIN OR (CUSTOM PROCEDURE TRAY) ×2 IMPLANT
KIT ROOM TURNOVER OR (KITS) ×2 IMPLANT
LOCK SCREW 1.5X9MM (Screw) ×2 IMPLANT
MANIFOLD NEPTUNE II (INSTRUMENTS) ×1 IMPLANT
NDL HYPO 25X1 1.5 SAFETY (NEEDLE) ×1 IMPLANT
NEEDLE HYPO 25X1 1.5 SAFETY (NEEDLE) ×2 IMPLANT
NS IRRIG 1000ML POUR BTL (IV SOLUTION) ×2 IMPLANT
PACK ORTHO EXTREMITY (CUSTOM PROCEDURE TRAY) ×2 IMPLANT
PAD ARMBOARD 7.5X6 YLW CONV (MISCELLANEOUS) ×4 IMPLANT
PAD CAST 4YDX4 CTTN HI CHSV (CAST SUPPLIES) ×1 IMPLANT
PADDING CAST COTTON 4X4 STRL (CAST SUPPLIES)
PLATE STRAIGHT LOCK 1.5 (Plate) ×1 IMPLANT
SCREW CORTICAL 1.5X9MM WRIST (Screw) ×2 IMPLANT
SCREW LOCK 1.5X9MM (Screw) IMPLANT
SCREW LOCKING 1.5X10 (Screw) ×1 IMPLANT
SOAP 2 % CHG 4 OZ (WOUND CARE) ×2 IMPLANT
SPONGE GAUZE 4X4 12PLY (GAUZE/BANDAGES/DRESSINGS) ×1 IMPLANT
STRIP CLOSURE SKIN 1/2X4 (GAUZE/BANDAGES/DRESSINGS) IMPLANT
SUCTION FRAZIER TIP 10 FR DISP (SUCTIONS) ×2 IMPLANT
SUT ETHILON 4 0 PS 2 18 (SUTURE) ×1 IMPLANT
SUT MERSILENE 4 0 P 3 (SUTURE) ×1 IMPLANT
SUT MNCRL AB 4-0 PS2 18 (SUTURE) IMPLANT
SUT PROLENE 4 0 TF (SUTURE) ×1 IMPLANT
SUT VIC AB 2-0 FS1 27 (SUTURE) ×1 IMPLANT
SUT VICRYL 4-0 PS2 18IN ABS (SUTURE) ×1 IMPLANT
SYR CONTROL 10ML LL (SYRINGE) ×1 IMPLANT
SYSTEM CHEST DRAIN TLS 7FR (DRAIN) IMPLANT
TOWEL OR 17X24 6PK STRL BLUE (TOWEL DISPOSABLE) ×2 IMPLANT
TOWEL OR 17X26 10 PK STRL BLUE (TOWEL DISPOSABLE) ×2 IMPLANT
TUBE CONNECTING 12X1/4 (SUCTIONS) ×2 IMPLANT
WATER STERILE IRR 1000ML POUR (IV SOLUTION) ×2 IMPLANT
YANKAUER SUCT BULB TIP NO VENT (SUCTIONS) ×1 IMPLANT

## 2012-09-04 NOTE — Anesthesia Preprocedure Evaluation (Addendum)
Anesthesia Evaluation  Patient identified by MRN, date of birth, ID band Patient awake    Reviewed: Allergy & Precautions, H&P , NPO status , Patient's Chart, lab work & pertinent test results  History of Anesthesia Complications Negative for: history of anesthetic complications  Airway Mallampati: I TM Distance: >3 FB Neck ROM: Full    Dental  (+) Teeth Intact and Dental Advisory Given   Pulmonary COPDCurrent Smoker,  breath sounds clear to auscultation+ rhonchi         Cardiovascular negative cardio ROS  Rhythm:Regular Rate:Normal     Neuro/Psych negative neurological ROS  negative psych ROS   GI/Hepatic Neg liver ROS, GERD- (depends on food)  ,  Endo/Other  negative endocrine ROS  Renal/GU negative Renal ROS     Musculoskeletal negative musculoskeletal ROS (+)   Abdominal   Peds  Hematology negative hematology ROS (+)   Anesthesia Other Findings hoarse  Reproductive/Obstetrics                         Anesthesia Physical Anesthesia Plan  ASA: II  Anesthesia Plan: General   Post-op Pain Management:    Induction: Intravenous  Airway Management Planned: LMA  Additional Equipment:   Intra-op Plan:   Post-operative Plan: Extubation in OR  Informed Consent: I have reviewed the patients History and Physical, chart, labs and discussed the procedure including the risks, benefits and alternatives for the proposed anesthesia with the patient or authorized representative who has indicated his/her understanding and acceptance.   Dental advisory given  Plan Discussed with: CRNA and Surgeon  Anesthesia Plan Comments:       Anesthesia Quick Evaluation

## 2012-09-04 NOTE — Preoperative (Signed)
Beta Blockers   Reason not to administer Beta Blockers:Not Applicable 

## 2012-09-04 NOTE — Brief Op Note (Signed)
09/04/2012  4:51 PM  PATIENT:  Julia Bean  47 y.o. female  PRE-OPERATIVE DIAGNOSIS:  RIGHT RING FINGER MIDDLE PHALANX FRACTURE/INFECTION  POST-OPERATIVE DIAGNOSIS:  * No post-op diagnosis entered *  PROCEDURE:  Procedure(s) (LRB) with comments: OPEN REDUCTION INTERNAL FIXATION (ORIF) METACARPAL (Right) - ORIF MIDDLE PHALANX FRACTURE AND DEBRIDEMENT  SURGEON:  Surgeon(s) and Role:    * [name], MD - Primary  PHYSICIAN ASSISTANT: none  ASSISTANTS: none   ANESTHESIA:   general  EBL:   none  BLOOD ADMINISTERED:none  DRAINS: none   LOCAL MEDICATIONS USED:  MARCAINE     SPECIMEN:  No Specimen  DISPOSITION OF SPECIMEN:  N/A  COUNTS:  YES  TOURNIQUET:  * Missing tourniquet times found for documented tourniquets in log:  [id number] *  DICTATION: .[phone]  PLAN OF CARE: Discharge to home after PACU  PATIENT DISPOSITION:  PACU - hemodynamically stable.   Delay start of Pharmacological VTE agent (>24hrs) due to surgical blood loss or risk of bleeding: not applicable

## 2012-09-04 NOTE — Anesthesia Postprocedure Evaluation (Signed)
  Anesthesia Post-op Note  Patient: Julia Bean  Procedure(s) Performed: Procedure(s) (LRB) with comments: OPEN REDUCTION INTERNAL FIXATION (ORIF) METACARPAL (Right) - ORIF MIDDLE PHALANX FRACTURE AND DEBRIDEMENT  Patient Location: PACU  Anesthesia Type:General  Level of Consciousness: awake  Airway and Oxygen Therapy: Patient Spontanous Breathing  Post-op Pain: mild  Post-op Assessment: Post-op Vital signs reviewed  Post-op Vital Signs: Reviewed  Complications: No apparent anesthesia complications

## 2012-09-04 NOTE — H&P (Signed)
Julia Bean is an 47 y.o. female.   Chief Complaint: DRAINAGE AND PAIN IN RIGHT RING FINGER HPI: OPEN INJURY TO RIGHT RING FINGER. HAD INIITIAL STABILIZATION AND AFTER PINS REMOVED NOTICED DRAINAGE FROM OLD PIN SITE PT WITH DISPLACEMENT OF FRACTURE AND WORSENING CLINICAL DEFORMITY  Past Medical History  Diagnosis Date  . GERD (gastroesophageal reflux disease)     from Keflex    Past Surgical History  Procedure Date  . Knee surgery   . Cesarean section   . Open reduction internal fixation (orif) metacarpal 07/25/2012    Procedure: OPEN REDUCTION INTERNAL FIXATION (ORIF) METACARPAL;  Surgeon: [name], MD;  Location: [facility] OR;  Service: Orthopedics;  Laterality: Right;  Open Reduction Internal Fixation Right Third Finger    History reviewed. No pertinent family history. Social History:  reports that she has been smoking.  She does not have any smokeless tobacco history on file. She reports that she drinks alcohol. She reports that she does not use illicit drugs.  Allergies: No Known Allergies  Medications Prior to Admission  Medication Sig Dispense Refill  . cephALEXin (KEFLEX) 500 MG capsule Take 500 mg by mouth 4 (four) times daily.      Marland Kitchen ibuprofen (ADVIL,MOTRIN) 200 MG tablet Take 400 mg by mouth every 6 (six) hours as needed.      . Multiple Vitamins-Minerals (MULTIVITAMINS THER. W/MINERALS) TABS Take 1 tablet by mouth daily.        Results for orders placed during the hospital encounter of 09/04/12 (from the past 48 hour(s))  CBC     Status: Abnormal   Collection Time   09/04/12  1:54 PM      Component Value Range Comment   WBC 9.3  4.0 - 10.5 K/uL    RBC 4.64  3.87 - 5.11 MIL/uL    Hemoglobin 15.1 (*) 12.0 - 15.0 g/dL    HCT 84.1  32.4 - 40.1 %    MCV 91.4  78.0 - 100.0 fL    MCH 32.5  26.0 - 34.0 pg    MCHC 35.6  30.0 - 36.0 g/dL    RDW 02.7  25.3 - 66.4 %    Platelets 249  150 - 400 K/uL   HCG, SERUM, QUALITATIVE     Status: Normal   Collection Time   09/04/12  1:54 PM      Component Value Range Comment   Preg, Serum NEGATIVE  NEGATIVE   SURGICAL PCR SCREEN     Status: Normal   Collection Time   09/04/12  1:57 PM      Component Value Range Comment   MRSA, PCR NEGATIVE  NEGATIVE    Staphylococcus aureus NEGATIVE  NEGATIVE    No results found.  NO RECENT ILLNESSES OR HOSPITALIZATIONS  Blood pressure 124/79, pulse 71, temperature 97.1 F (36.2 C), temperature source Oral, resp. rate 20, height 5\' 11"  (1.803 m), weight 95.8 kg (211 lb 3.2 oz), last menstrual period 08/13/2012, SpO2 99.00%. General Appearance:  Alert, cooperative, no distress, appears stated age  Head:  Normocephalic, without obvious abnormality, atraumatic  Eyes:  Pupils equal, conjunctiva/corneas clear,         Throat: Lips, mucosa, and tongue normal; teeth and gums normal  Neck: No visible masses     Lungs:   respirations unlabored  Chest Wall:  No tenderness or deformity  Heart:  Regular rate and rhythm,  Abdomen:   Soft, non-tender,         Extremities: RIGHT RING  FINGER: OPEN WOUND DORSALLY NO DRAINAGE. MILD DEFORMITY TO RING FINGER LIMITED MOTION TO FINGER GOOD MOBILITY TO LONG/INDEX AND SMALL  Pulses: 2+ and symmetric  Skin: Skin color, texture, turgor normal, no rashes or lesions     Neurologic: Normal    Assessment/Plan RIGHT RING FINGER OPEN MIDDLE PHALANX FRACTURE  RIGHT RING FINGER DEBRIDEMENT AND BONE STABILIZATION OPEN REDUCTION AND INTERNAL FIXATION  R/B/A DISCUSSED WITH PT IN OFFICE.  PT VOICED UNDERSTANDING OF PLAN CONSENT SIGNED DAY OF SURGERY PT SEEN AND EXAMINED PRIOR TO OPERATIVE PROCEDURE/DAY OF SURGERY SITE MARKED. QUESTIONS ANSWERED WILL [facility] FOLLOWING SURGERY  [name] 09/04/2012, 4:38 PM

## 2012-09-04 NOTE — Transfer of Care (Signed)
Immediate Anesthesia Transfer of Care Note  Patient: Julia Bean  Procedure(s) Performed: Procedure(s) (LRB) with comments: OPEN REDUCTION INTERNAL FIXATION (ORIF) METACARPAL (Right) - ORIF MIDDLE PHALANX FRACTURE AND DEBRIDEMENT  Patient Location: PACU  Anesthesia Type:General  Level of Consciousness: awake, alert  and patient cooperative  Airway & Oxygen Therapy: Patient Spontanous Breathing and Patient connected to nasal cannula oxygen  Post-op Assessment: Report given to PACU RN and Post -op Vital signs reviewed and stable  Post vital signs: Reviewed and stable  Complications: No apparent anesthesia complications

## 2012-09-04 NOTE — Anesthesia Procedure Notes (Signed)
Procedure Name: LMA Insertion Date/Time: 09/04/2012 4:59 PM Performed by: Leona Singleton A Pre-anesthesia Checklist: Patient identified Patient Re-evaluated:Patient Re-evaluated prior to inductionOxygen Delivery Method: Circle system utilized Preoxygenation: Pre-oxygenation with 100% oxygen Intubation Type: IV induction LMA: LMA inserted LMA Size: 4.0 Tube type: Oral Number of attempts: 1 Placement Confirmation: positive ETCO2,  CO2 detector and breath sounds checked- equal and bilateral Tube secured with: Tape Dental Injury: Teeth and Oropharynx as per pre-operative assessment

## 2012-09-05 NOTE — Op Note (Signed)
NAMECECILEY, Julia Bean              ACCOUNT NO.:  [id number]  MEDICAL RECORD NO.:  [id number]  LOCATION:  [facility]                         FACILITY:  [facility]  PHYSICIAN:  Madelynn Done, MD  DATE OF BIRTH:  Dec 29, 1965  DATE OF PROCEDURE:  09/04/2012 DATE OF DISCHARGE:  09/04/2012                              OPERATIVE REPORT   PREOPERATIVE DIAGNOSIS:  Right ring finger middle phalanx malunion.  POSTOPERATIVE DIAGNOSIS:  Right ring finger middle phalanx malunion.  ATTENDING PHYSICIAN:  Madelynn Done, MD who scrubbed and present for the entire procedure.  ASSISTANT SURGEON:  None.  ANESTHESIA:  General via LMA.  SURGICAL PROCEDURE: 1. Open treatment of right middle phalanx fracture with internal     fixation. 2. Right middle phalanx debridement of skin, subcutaneous tissue, and     bone associated with open fracture.  SURGICAL IMPLANTS:  [employer] De[employer]uy ALPS 1.5-mm plate with 2 screws proximally, 2 screws distally, total 4 screws, 2 locking, 2 nonlocking and 1.5 mm screws.  SURGICAL INDICATIONS:  Ms. Wickey is a 47 year old female who sustained an injury in a long splinter.  The patient underwent operative stabilization at the time of her injury and presented to the office with further displacement and concern for infection.  She had drainage coming from where the pin site was.  Risks, benefits, and alternatives were discussed in detail with the patient and signed informed consent was obtained.  Risks include, but not limited to bleeding, infection, damage to nearby nerves, arteries, or tendons, nonunion, malunion, hardware failure, loss of motion of wrist and digits, and need for further surgical intervention.  DESCRIPTION OF PROCEDURE:  The patient was appropriately identified in the preop holding area and mark with a permanent marker made on the right ring finger to indicate correct operative site.  The patient was then brought back to the operating room placed  supine on the anesthesia room table, where general anesthesia was administered.  The patient tolerated this well.  A well-padded tourniquet was then placed in a right brachium and sealed with 1000 drape.  Right upper extremity was then prepped and draped in normal sterile fashion.  Time-out was called. Correct site was identified, and the procedure was then begun. Attention was then turned to the right ring finger where a longitudinal incision was made directly over the dorsal aspect of the finger.  Limb was then elevated and tourniquet insufflated.  Dissection was carried down through the skin and subcutaneous tissue.  The extensor interval was then split longitudinally, exposing the fracture site.  Debridement of skin, subcutaneous tissue, and bone was then carried out.  The bone looked pretty good.  Did not see any evidence of gross purulence.  The pin track site was then carefully debrided with a small curette and a small amount of purulence was expressed.  The wound was then copiously irrigated.  After debridement of skin, subcutaneous tissue, and bone. Attention was then turned stabilization of the fracture.  Given the possibility of infection, the important part was for stability of the bone.  Therefore, the plate fixation was then used.  A 1.5-mm plate was then applied on the dorsal surface of the bone.  It was held in place temporary with 1 screw proximally and 1 screw distally with a 1.1 drill bit and 1.5 mm screw.  Attention was then turned distally and approximately another 1.5-mm screw was then placed, locking screw, to complete the construct.  The wound was then thoroughly irrigated.  The final radiographs were obtained of the finger which did show near anatomical alignment.  Copious wound irrigation done throughout.  The extensor interval was then closed over the plate with 4-0 Mersilene suture.  Skin was then closed using 4-0 Prolene.  The tourniquet was deflated.  There  was good perfusion to fingertip.  A 9 mL of 0.25% Marcaine infiltrated locally.  Adaptic dressing, sterile compressive bandage was applied.  The patient was placed in a finger splint. Extubated and taken to recovery room in good condition. Intraoperative radiographs, 3 views, of the finger did show the internal fixation in place in good position.  POSTPROCEDURE PLAN:  The patient discharged home, seen back, continue on the oral antibiotics.  Oral pain medication.  I plan to see her back approximately in 7-10 days for wound check and suture removal, x-rays, and then begin some active range of motion.  Continue the oral antibiotics until the wound is healed.     Madelynn Done, MD     FWO/MEDQ  D:  09/04/2012  T:  09/05/2012  Job:  [id number]

## 2012-09-08 ENCOUNTER — Encounter (HOSPITAL_COMMUNITY): Payer: Self-pay | Admitting: Orthopedic Surgery

## 2013-01-29 ENCOUNTER — Encounter: Payer: Self-pay | Admitting: *Deleted

## 2013-01-29 ENCOUNTER — Encounter: Payer: Self-pay | Admitting: Internal Medicine

## 2013-01-29 ENCOUNTER — Ambulatory Visit (INDEPENDENT_AMBULATORY_CARE_PROVIDER_SITE_OTHER): Payer: Medicaid Other | Admitting: Internal Medicine

## 2013-01-29 VITALS — BP 115/78 | HR 65 | Temp 98.4°F | Ht 71.0 in | Wt 210.0 lb

## 2013-01-29 DIAGNOSIS — M869 Osteomyelitis, unspecified: Secondary | ICD-10-CM

## 2013-01-29 MED ORDER — AMOXICILLIN-POT CLAVULANATE 875-125 MG PO TABS: 1.0000 | ORAL_TABLET | Freq: Two times a day (BID) | ORAL | Status: DC

## 2013-01-29 NOTE — Assessment & Plan Note (Signed)
There is no positive cultures that I have found and she does have osteomyelitis with a history of exposed bone and x-rays. She is on doxycycline however I will broaden her coverage to include more streptococcal coverage and anaerobes. She did do well with clindamycin so suspect anaerobes may be part of the issue and she is MRSA negative from her surgical screen so I do not suspect a resistant staph aureus at this time and therefore we'll change her to Augmentin. She is going to start that twice a day and I will continue this for several months. Ideally, at some point she will get the hardware removed, and then I would treat her for another 3 months to complete a course of osteomyelitis without the hardware. I will have her return in 4 weeks after she again sees her hand surgeon and to see how she is tolerating the antibiotics. She knows to call if she has any side effects of antibiotics.

## 2013-01-29 NOTE — Progress Notes (Signed)
  Subjective:    Patient ID: Julia Bean, female    DOB: 05-04-1966, 47 y.o.   MRN: [id number]  HPI She comes in for evaluation as a new patient. She has osteomyelitis of the fourth right finger. This started in December when she had an accident with a wood chipper and crushed her finger. She then underwent surgical debridement and later developed osteomyelitis. She was initially started on Keflex and she didn't require pin and screw placement in order to try to get bone to heal together. She went back to the operating room in February for further debridement. She then was changed to clindamycin however took that for about 3 weeks and did not tolerate it. She did have a good result with that however had to be changed again and started doxycycline. She has had continued small amount of drainage from the finger and does state that it really has never healed. She denies fever or chills. She does have pain and screw in place that can't be removed until the bone is stabilized. She tells me that she is hopeful in August that those can be removed. No diarrhea or   Review of Systems  Constitutional: Negative for fever and chills.  Respiratory: Negative for shortness of breath.   Gastrointestinal: Negative for nausea, abdominal pain and diarrhea.  Musculoskeletal: Positive for joint swelling. Negative for myalgias and arthralgias.  Skin: Positive for wound. Negative for rash.  Neurological: Negative for dizziness and headaches.  Hematological: Negative for adenopathy.  Psychiatric/Behavioral: Negative for dysphoric mood.       Objective:   Physical Exam  Constitutional: She is oriented to person, place, and time. She appears well-developed and well-nourished. No distress.  Eyes: No scleral icterus.  Cardiovascular: Normal rate, regular rhythm and normal heart sounds.   No murmur heard. Pulmonary/Chest: Effort normal and breath sounds normal. No respiratory distress. She has no wheezes.   Abdominal: Bowel sounds are normal.  Musculoskeletal:  Fourth finger with mild edema but no warmth. There is an open area that is scabbed over on the top now.  Neurological: She is alert and oriented to person, place, and time.  Skin: No rash noted.  Psychiatric: She has a normal mood and affect. Her behavior is normal.          Assessment & Plan:

## 2013-02-25 ENCOUNTER — Ambulatory Visit (INDEPENDENT_AMBULATORY_CARE_PROVIDER_SITE_OTHER): Payer: Medicaid Other | Admitting: Internal Medicine

## 2013-02-25 ENCOUNTER — Encounter: Payer: Self-pay | Admitting: Internal Medicine

## 2013-02-25 VITALS — BP 116/79 | HR 58 | Temp 98.2°F | Ht 71.0 in | Wt 212.0 lb

## 2013-02-25 DIAGNOSIS — M869 Osteomyelitis, unspecified: Secondary | ICD-10-CM

## 2013-02-25 NOTE — Assessment & Plan Note (Signed)
I am going to have her continue with Augmentin. I will see her in about one month and check inflammatory markers and plan on having her on Augmentin for 2-3 months to complete treatment for osteomyelitis once the hardware is out.

## 2013-02-25 NOTE — Progress Notes (Signed)
  Subjective:    Patient ID: Julia Bean, female    DOB: 10/01/1965, 47 y.o.   MRN: [id number]  HPI She comes in for her second visit. She has osteomyelitis of the fourth right finger. This started in December when she had an accident with a wood chipper and crushed her finger. She then underwent surgical debridement and later developed osteomyelitis. She was initially started on Keflex and she didn't require pin and screw placement in order to try to get bone to heal together. She went back to the operating room in February for further debridement. She then was changed to clindamycin however took that for about 3 weeks and did not tolerate it. She did have a good result with that however had to be changed again and started doxycycline.  I then saw her as a new patient last month and started her on Augmentin. She comes in now on Augmentin and is doing well. Her finger is not swollen or red. No significant pain fever or chills. She is due to get the pin in screws out in August 18. She does have osteomyelitis of the finger.    Review of Systems  Constitutional: Negative for fever and chills.  Gastrointestinal: Negative for diarrhea.  Musculoskeletal: Negative for arthralgias.  Skin: Negative for rash.  Neurological: Negative for dizziness and headaches.       Objective:   Physical Exam  Musculoskeletal:  Finger is not swollen, no warmth or significant erythema. The incision area is closed with no discharge.          Assessment & Plan:

## 2013-03-13 ENCOUNTER — Ambulatory Visit (INDEPENDENT_AMBULATORY_CARE_PROVIDER_SITE_OTHER): Payer: Medicaid Other | Admitting: Family Medicine

## 2013-03-13 ENCOUNTER — Encounter: Payer: Self-pay | Admitting: Family Medicine

## 2013-03-13 ENCOUNTER — Ambulatory Visit (INDEPENDENT_AMBULATORY_CARE_PROVIDER_SITE_OTHER): Payer: Medicaid Other

## 2013-03-13 VITALS — BP 102/69 | HR 80 | Temp 98.3°F | Ht 71.0 in | Wt 211.0 lb

## 2013-03-13 DIAGNOSIS — M25569 Pain in unspecified knee: Secondary | ICD-10-CM

## 2013-03-13 DIAGNOSIS — M25562 Pain in left knee: Secondary | ICD-10-CM

## 2013-03-13 MED ORDER — MELOXICAM 15 MG PO TABS: 15.0000 mg | ORAL_TABLET | Freq: Every day | ORAL | Status: DC

## 2013-03-13 NOTE — Progress Notes (Signed)
  Subjective:    Patient ID: Julia Bean, female    DOB: [birth year], 47 y.o.   MRN: [id number]  HPI Knee pain and swelling times one to 2 weeks. Patient notes progressive knee swelling over the past week involving the left knee. Patient is a prior history of knee surgery in the 1980s for meniscal/tendon repair. Also had atraumatic the about 5 years ago landing on the lateral aspect of the left knee. Swelling CV predominantly over the same lateral area. No known injury. Swelling has seemed to improve over the course of the week. Marland KitchenHas mild pain. Minimal swelling. No knee locking or giving way. Neurovascularly intact distally.   Review of Systems  All other systems reviewed and are negative.       Objective:   Physical Exam  Constitutional: She appears well-developed and well-nourished.  HENT:  Head: Normocephalic and atraumatic.  Eyes: Conjunctivae are normal. Pupils are equal, round, and reactive to light.  Neck: Normal range of motion.  Cardiovascular: Normal rate and regular rhythm.   Pulmonary/Chest: Effort normal.  Abdominal: Soft.  Musculoskeletal:       Legs: Neurological: She is alert.  Skin: Skin is warm.    [facility] reading (PRIMARY) by  Dr. Alvester Morin Knee x-rays pulmonary negative for any acute dislocation or fracture. Noted degenerative changes and joint space narrowing medially bilaterally.                                        Assessment & Plan:  Knee pain, left - Plan: DG Knee 1-2 Views Left, meloxicam (MOBIC) 15 MG tablet  Suspect mild lateral knee sprain. No discernible fluid pocket on exam that would warrant draining. Will place patient on course of Mobic as well as a brace. Discussed general care musculoskeletal or flags. Consider or the referral if symptoms persist despite treatment.

## 2013-03-17 ENCOUNTER — Telehealth: Payer: Self-pay | Admitting: *Deleted

## 2013-03-17 NOTE — Telephone Encounter (Signed)
She probably doesn't need to see me unless she wants to as long as she sees Dr. Orlan Leavens.  She should just take the Augmentin about 2-3 months after hardware removal.  If she wants to be seen, you can double book her before 9/1.

## 2013-03-17 NOTE — Telephone Encounter (Signed)
Patient will be losing her insurance as of 03/30/13 and will not be able to pay for a f/u visit. She has since seen Dr. Orlan Leavens and the hardware was removed and she will be able to continue to take the Augmentin. She sees Dr. Orlan Leavens in f/u 03/26/13. Would you like to be double booked some time this week, defer to Dr. Orlan Leavens or something else, please advise. Wendall Mola

## 2013-03-18 NOTE — Telephone Encounter (Signed)
Patient notified and would prefer to see Dr. Orlan Leavens and continue the Augmentin x 3 months, she has enough refills. Advised to call if she notices and worrisome changes. Wendall Mola

## 2013-04-02 ENCOUNTER — Ambulatory Visit: Payer: Medicaid Other | Admitting: Internal Medicine

## 2013-06-04 ENCOUNTER — Other Ambulatory Visit: Payer: Self-pay

## 2014-01-06 IMAGING — CR DG HAND COMPLETE 3+V*R*
3 series · 4 of 4 positions shown · non-contrast
Comparison: None.

CLINICAL DATA: Finger versus Igner Zambrana

RIGHT HAND - COMPLETE 3+ VIEW

[Series 4001: view not recorded · 0.15mm/px · 2 of 2 slices shown (1 of 3)]
[im 1/2]
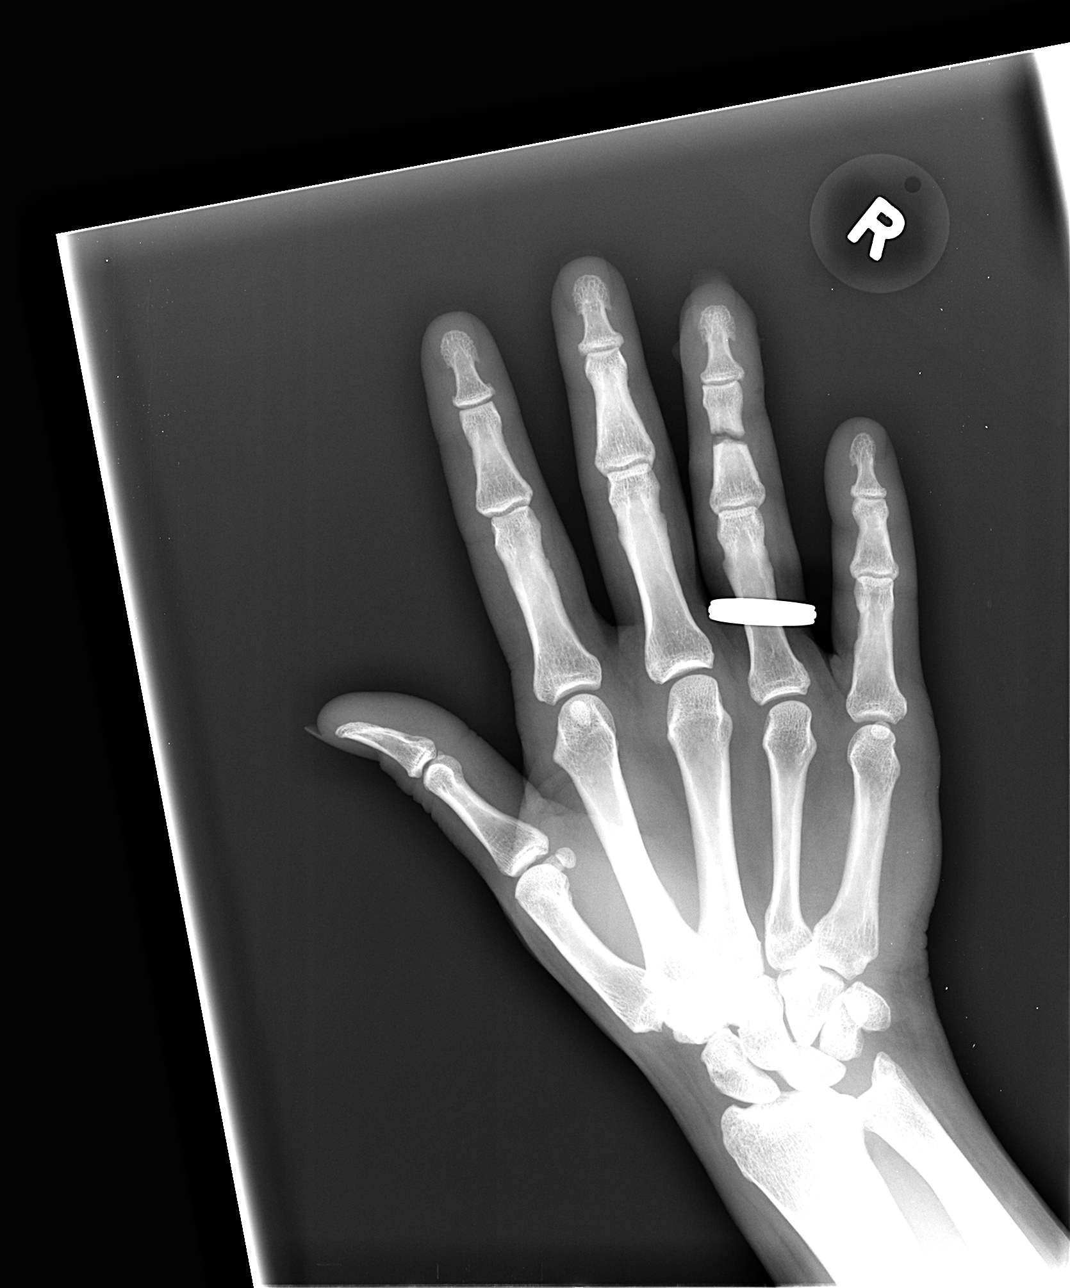
[im 2/2]
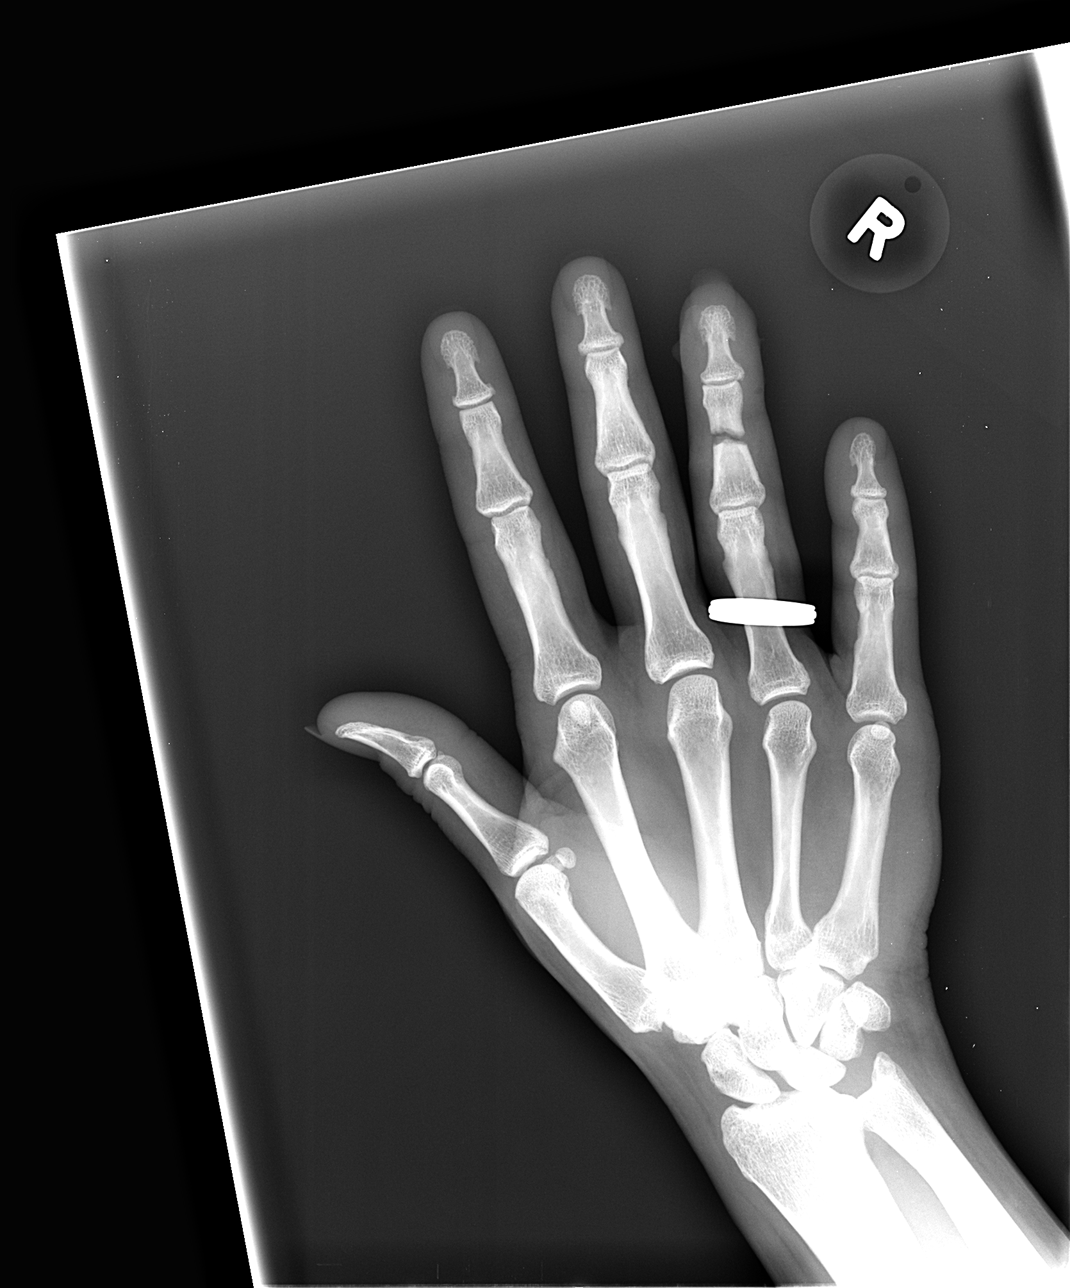

[view not recorded (2 of 3)]
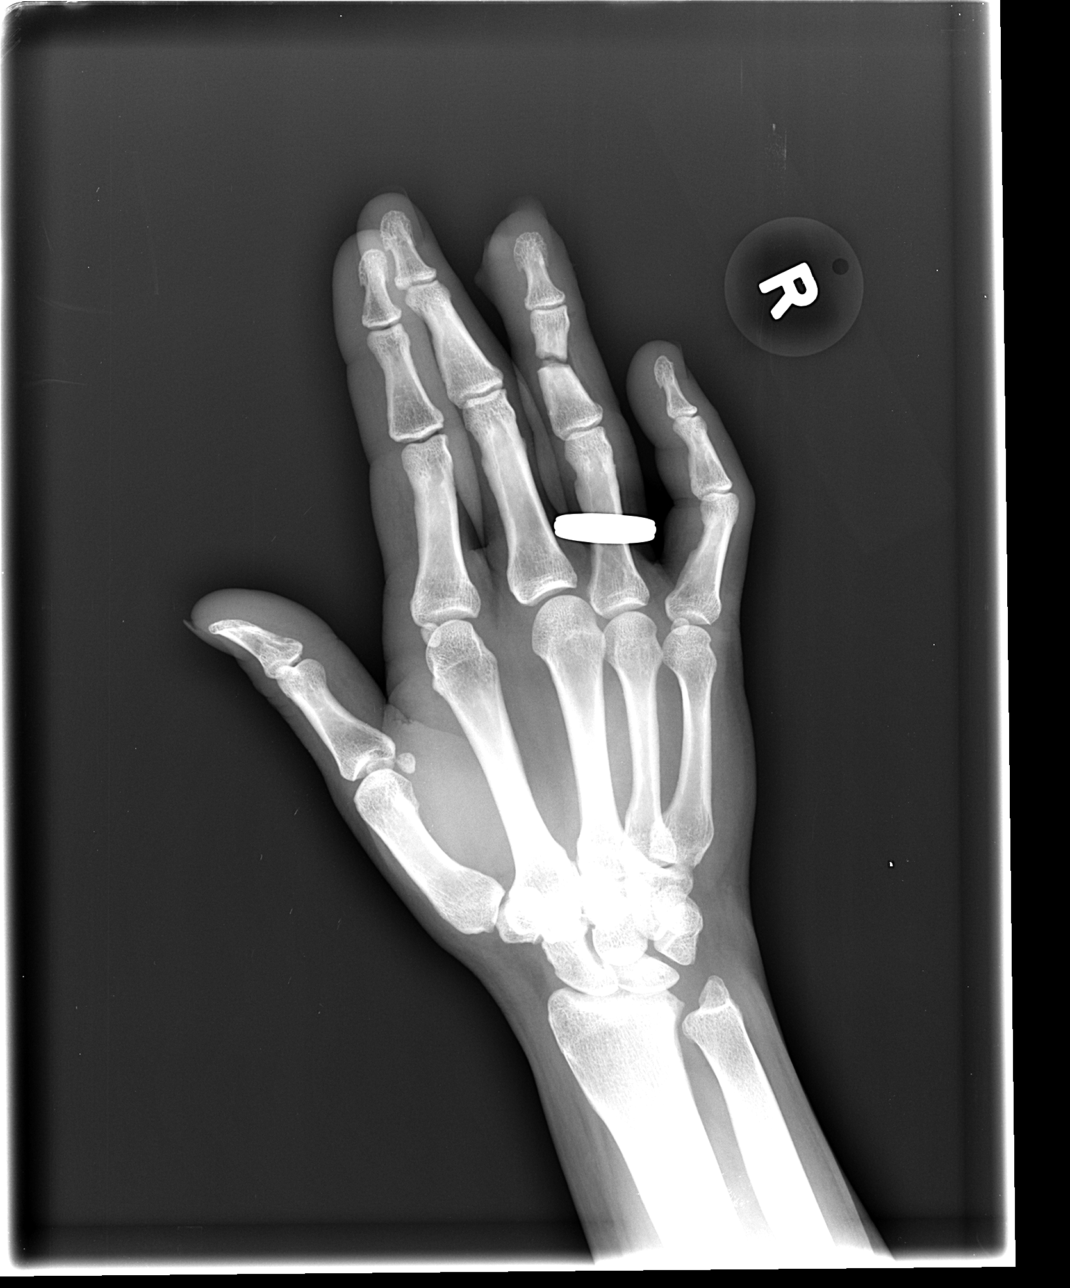

[view not recorded (3 of 3)]
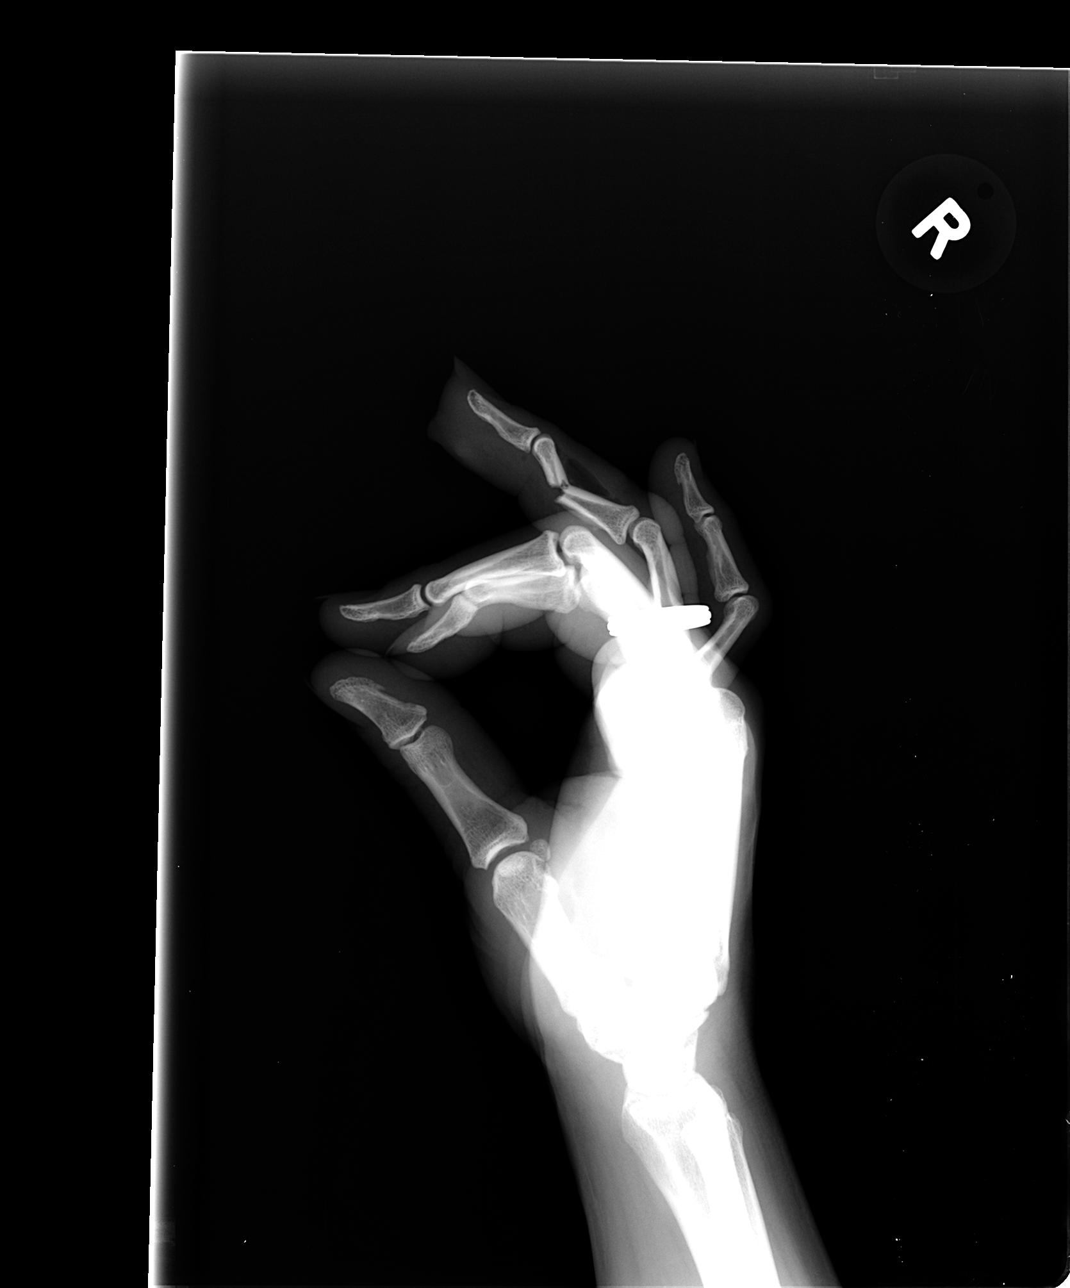

[4 of 4 positions shown; findings below may reference images not displayed]

FINDINGS: Transverse fracture across the middle phalanx right ring
finger, distracted 2-3 mm.  There is gas in the fracture and
adjacent subcutaneous tissues indicating open fracture.  There is
dorsal angulation of the distal fracture fragment.  No other bony
abnormality is evident.
IMPRESSION: 1.  Open fracture, middle phalanx right ring finger

## 2014-03-03 ENCOUNTER — Telehealth: Payer: Self-pay | Admitting: Nurse Practitioner

## 2014-03-03 NOTE — Telephone Encounter (Signed)
aap given

## 2014-03-04 ENCOUNTER — Ambulatory Visit (INDEPENDENT_AMBULATORY_CARE_PROVIDER_SITE_OTHER): Payer: Medicaid Other | Admitting: Family

## 2014-03-04 ENCOUNTER — Encounter: Payer: Self-pay | Admitting: Family

## 2014-03-04 VITALS — BP 108/68 | HR 64 | Temp 98.4°F | Ht 71.0 in | Wt 198.8 lb

## 2014-03-04 DIAGNOSIS — S8391XS Sprain of unspecified site of right knee, sequela: Secondary | ICD-10-CM

## 2014-03-04 DIAGNOSIS — IMO0002 Reserved for concepts with insufficient information to code with codable children: Secondary | ICD-10-CM

## 2014-03-04 MED ORDER — TRAMADOL HCL 50 MG PO TABS: 50.0000 mg | ORAL_TABLET | Freq: Three times a day (TID) | ORAL | Status: DC | PRN

## 2014-03-04 MED ORDER — MELOXICAM 15 MG PO TABS: 15.0000 mg | ORAL_TABLET | Freq: Every day | ORAL | Status: DC

## 2014-03-04 NOTE — Progress Notes (Signed)
   Subjective:    Patient ID: Julia Bean, female    DOB: 01/03/1966, 48 y.o.   MRN: [id number]  Knee Pain  The incident occurred more than 1 week ago. The incident occurred in the yard. Injury mechanism: Pt was "crawling around on the ground picking peaches" The quality of the pain is described as shooting. The pain is at a severity of 8/10. The pain is moderate. The pain has been fluctuating since onset. Associated symptoms include a loss of motion. Pertinent negatives include no inability to bear weight, loss of sensation, numbness or tingling. She reports no foreign bodies present. The symptoms are aggravated by movement and weight bearing. She has tried non-weight bearing and NSAIDs for the symptoms. The treatment provided moderate relief.      Review of Systems  Constitutional: Negative.   HENT: Negative.   Eyes: Negative.   Respiratory: Negative.  Negative for shortness of breath.   Cardiovascular: Negative.  Negative for palpitations.  Gastrointestinal: Negative.   Endocrine: Negative.   Genitourinary: Negative.   Musculoskeletal: Positive for gait problem and joint swelling.  Neurological: Negative.  Negative for tingling, numbness and headaches.  Hematological: Negative.   Psychiatric/Behavioral: Negative.   All other systems reviewed and are negative.      Objective:   Physical Exam  Vitals reviewed. Constitutional: She is oriented to person, place, and time. She appears well-developed and well-nourished. No distress.  Cardiovascular: Normal rate, regular rhythm, normal heart sounds and intact distal pulses.   No murmur heard. Pulmonary/Chest: Effort normal and breath sounds normal. No respiratory distress. She has no wheezes.  Abdominal: Soft. Bowel sounds are normal. She exhibits no distension. There is no tenderness.  Musculoskeletal: Normal range of motion. She exhibits no edema and no tenderness.  Decreased ROM of right knee with flexion due to pain     Neurological: She is alert and oriented to person, place, and time. She has normal reflexes. No cranial nerve deficit.  Skin: Skin is warm and dry.  Psychiatric: She has a normal mood and affect. Her behavior is normal. Judgment and thought content normal.      BP 108/68  Pulse 64  Temp(Src) 98.4 F (36.9 C) (Oral)  Ht 5\' 11"  (1.803 m)  Wt 198 lb 12.8 oz (90.175 kg)  BMI 27.74 kg/m2     Assessment & Plan:  1. Right knee sprain, sequela -Rest -Ice- Elevation  -If does not improve pt may need ortho consult- Pt states she has had knee "problems for years" and x-rays and MRI - traMADol (ULTRAM) 50 MG tablet; Take 1 tablet (50 mg total) by mouth every 8 (eight) hours as needed.  Dispense: 30 tablet; Refill: 0 - meloxicam (MOBIC) 15 MG tablet; Take 1 tablet (15 mg total) by mouth daily.  Dispense: 30 tablet; Refill: 1  Jannifer Rodneyhristy Aseel Uhde, FNP

## 2014-03-04 NOTE — Patient Instructions (Signed)

## 2014-03-26 ENCOUNTER — Telehealth: Payer: Self-pay | Admitting: Family

## 2014-03-26 ENCOUNTER — Encounter: Payer: Self-pay | Admitting: Family

## 2014-03-26 ENCOUNTER — Ambulatory Visit (INDEPENDENT_AMBULATORY_CARE_PROVIDER_SITE_OTHER): Payer: Medicaid Other | Admitting: Family

## 2014-03-26 VITALS — BP 114/70 | HR 63 | Temp 98.4°F | Ht 71.0 in | Wt 197.4 lb

## 2014-03-26 DIAGNOSIS — Z124 Encounter for screening for malignant neoplasm of cervix: Secondary | ICD-10-CM

## 2014-03-26 DIAGNOSIS — Z01419 Encounter for gynecological examination (general) (routine) without abnormal findings: Secondary | ICD-10-CM

## 2014-03-26 DIAGNOSIS — Z0000 Encounter for general adult medical examination without abnormal findings: Secondary | ICD-10-CM

## 2014-03-26 DIAGNOSIS — Z1231 Encounter for screening mammogram for malignant neoplasm of breast: Secondary | ICD-10-CM

## 2014-03-26 DIAGNOSIS — G5602 Carpal tunnel syndrome, left upper limb: Secondary | ICD-10-CM

## 2014-03-26 LAB — POCT URINALYSIS DIPSTICK
BILIRUBIN UA: NEGATIVE
Blood, UA: NEGATIVE
GLUCOSE UA: NEGATIVE
KETONES UA: NEGATIVE
LEUKOCYTES UA: NEGATIVE
NITRITE UA: NEGATIVE
Protein, UA: NEGATIVE
Spec Grav, UA: <=1.005
Urobilinogen, UA: NEGATIVE
pH, UA: 7

## 2014-03-26 LAB — POCT UA - MICROSCOPIC ONLY
BACTERIA, U MICROSCOPIC: NEGATIVE
CASTS, UR, LPF, POC: NEGATIVE
Crystals, Ur, HPF, POC: NEGATIVE
MUCUS UA: NEGATIVE
RBC, urine, microscopic: NEGATIVE
WBC, UR, HPF, POC: NEGATIVE
Yeast, UA: NEGATIVE

## 2014-03-26 NOTE — Telephone Encounter (Signed)
Left message on voicemail that referral placed and she should receive a call within a week.

## 2014-03-26 NOTE — Patient Instructions (Addendum)
Health Maintenance Adopting a healthy lifestyle and getting preventive care can go a long way to promote health and wellness. Talk with your health care provider about what schedule of regular examinations is right for you. This is a good chance for you to check in with your provider about disease prevention and staying healthy. In between checkups, there are plenty of things you can do on your own. Experts have done a lot of research about which lifestyle changes and preventive measures are most likely to keep you healthy. Ask your health care provider for more information. WEIGHT AND DIET  Eat a healthy diet  Be sure to include plenty of vegetables, fruits, low-fat dairy products, and lean protein.  Do not eat a lot of foods high in solid fats, added sugars, or salt.  Get regular exercise. This is one of the most important things you can do for your health.  Most adults should exercise for at least 150 minutes each week. The exercise should increase your heart rate and make you sweat (moderate-intensity exercise).  Most adults should also do strengthening exercises at least twice a week. This is in addition to the moderate-intensity exercise.  Maintain a healthy weight  Body mass index (BMI) is a measurement that can be used to identify possible weight problems. It estimates body fat based on height and weight. Your health care provider can help determine your BMI and help you achieve or maintain a healthy weight.  For females 25 years of age and older:   A BMI below 18.5 is considered underweight.  A BMI of 18.5 to 24.9 is normal.  A BMI of 25 to 29.9 is considered overweight.  A BMI of 30 and above is considered obese.  Watch levels of cholesterol and blood lipids  You should start having your blood tested for lipids and cholesterol at 47 years of age, then have this test every 5 years.  You may need to have your cholesterol levels checked more often if:  Your lipid or  cholesterol levels are high.  You are older than 47 years of age.  You are at high risk for heart disease.  CANCER SCREENING   Lung Cancer  Lung cancer screening is recommended for adults 97-92 years old who are at high risk for lung cancer because of a history of smoking.  A yearly low-dose CT scan of the lungs is recommended for people who:  Currently smoke.  Have quit within the past 15 years.  Have at least a 30-pack-year history of smoking. A pack year is smoking an average of one pack of cigarettes a day for 1 year.  Yearly screening should continue until it has been 15 years since you quit.  Yearly screening should stop if you develop a health problem that would prevent you from having lung cancer treatment.  Breast Cancer  Practice breast self-awareness. This means understanding how your breasts normally appear and feel.  It also means doing regular breast self-exams. Let your health care provider know about any changes, no matter how small.  If you are in your 20s or 30s, you should have a clinical breast exam (CBE) by a health care provider every 1-3 years as part of a regular health exam.  If you are 76 or older, have a CBE every year. Also consider having a breast X-ray (mammogram) every year.  If you have a family history of breast cancer, talk to your health care provider about genetic screening.  If you are  at high risk for breast cancer, talk to your health care provider about having an MRI and a mammogram every year.  Breast cancer gene (BRCA) assessment is recommended for women who have family members with BRCA-related cancers. BRCA-related cancers include:  Breast.  Ovarian.  Tubal.  Peritoneal cancers.  Results of the assessment will determine the need for genetic counseling and BRCA1 and BRCA2 testing. Cervical Cancer Routine pelvic examinations to screen for cervical cancer are no longer recommended for nonpregnant women who are considered low  risk for cancer of the pelvic organs (ovaries, uterus, and vagina) and who do not have symptoms. A pelvic examination may be necessary if you have symptoms including those associated with pelvic infections. Ask your health care provider if a screening pelvic exam is right for you.   The Pap test is the screening test for cervical cancer for women who are considered at risk.  If you had a hysterectomy for a problem that was not cancer or a condition that could lead to cancer, then you no longer need Pap tests.  If you are older than 65 years, and you have had normal Pap tests for the past 10 years, you no longer need to have Pap tests.  If you have had past treatment for cervical cancer or a condition that could lead to cancer, you need Pap tests and screening for cancer for at least 20 years after your treatment.  If you no longer get a Pap test, assess your risk factors if they change (such as having a new sexual partner). This can affect whether you should start being screened again.  Some women have medical problems that increase their chance of getting cervical cancer. If this is the case for you, your health care provider may recommend more frequent screening and Pap tests.  The human papillomavirus (HPV) test is another test that may be used for cervical cancer screening. The HPV test looks for the virus that can cause cell changes in the cervix. The cells collected during the Pap test can be tested for HPV.  The HPV test can be used to screen women 30 years of age and older. Getting tested for HPV can extend the interval between normal Pap tests from three to five years.  An HPV test also should be used to screen women of any age who have unclear Pap test results.  After 47 years of age, women should have HPV testing as often as Pap tests.  Colorectal Cancer  This type of cancer can be detected and often prevented.  Routine colorectal cancer screening usually begins at 47 years of  age and continues through 47 years of age.  Your health care provider may recommend screening at an earlier age if you have risk factors for colon cancer.  Your health care provider may also recommend using home test kits to check for hidden blood in the stool.  A small camera at the end of a tube can be used to examine your colon directly (sigmoidoscopy or colonoscopy). This is done to check for the earliest forms of colorectal cancer.  Routine screening usually begins at age 50.  Direct examination of the colon should be repeated every 5-10 years through 47 years of age. However, you may need to be screened more often if early forms of precancerous polyps or small growths are found. Skin Cancer  Check your skin from head to toe regularly.  Tell your health care provider about any new moles or changes in   moles, especially if there is a change in a mole's shape or color.  Also tell your health care provider if you have a mole that is larger than the size of a pencil eraser.  Always use sunscreen. Apply sunscreen liberally and repeatedly throughout the day.  Protect yourself by wearing long sleeves, pants, a wide-brimmed hat, and sunglasses whenever you are outside. HEART DISEASE, DIABETES, AND HIGH BLOOD PRESSURE   Have your blood pressure checked at least every 1-2 years. High blood pressure causes heart disease and increases the risk of stroke.  If you are between 75 years and 42 years old, ask your health care provider if you should take aspirin to prevent strokes.  Have regular diabetes screenings. This involves taking a blood sample to check your fasting blood sugar level.  If you are at a normal weight and have a low risk for diabetes, have this test once every three years after 47 years of age.  If you are overweight and have a high risk for diabetes, consider being tested at a younger age or more often. PREVENTING INFECTION  Hepatitis B  If you have a higher risk for  hepatitis B, you should be screened for this virus. You are considered at high risk for hepatitis B if:  You were born in a country where hepatitis B is common. Ask your health care provider which countries are considered high risk.  Your parents were born in a high-risk country, and you have not been immunized against hepatitis B (hepatitis B vaccine).  You have HIV or AIDS.  You use needles to inject street drugs.  You live with someone who has hepatitis B.  You have had sex with someone who has hepatitis B.  You get hemodialysis treatment.  You take certain medicines for conditions, including cancer, organ transplantation, and autoimmune conditions. Hepatitis C  Blood testing is recommended for:  Everyone born from [birth year] through 1964.  Anyone with known risk factors for hepatitis C. Sexually transmitted infections (STIs)  You should be screened for sexually transmitted infections (STIs) including gonorrhea and chlamydia if:  You are sexually active and are younger than 48 years of age.  You are older than 47 years of age and your health care provider tells you that you are at risk for this type of infection.  Your sexual activity has changed since you were last screened and you are at an increased risk for chlamydia or gonorrhea. Ask your health care provider if you are at risk.  If you do not have HIV, but are at risk, it may be recommended that you take a prescription medicine daily to prevent HIV infection. This is called pre-exposure prophylaxis (PrEP). You are considered at risk if:  You are sexually active and do not regularly use condoms or know the HIV status of your partner(s).  You take drugs by injection.  You are sexually active with a partner who has HIV. Talk with your health care provider about whether you are at high risk of being infected with HIV. If you choose to begin PrEP, you should first be tested for HIV. You should then be tested every 3 months for  as long as you are taking PrEP.  PREGNANCY   If you are premenopausal and you may become pregnant, ask your health care provider about preconception counseling.  If you may become pregnant, take 400 to 800 micrograms (mcg) of folic acid every day.  If you want to prevent pregnancy, talk to your  health care provider about birth control (contraception). OSTEOPOROSIS AND MENOPAUSE   Osteoporosis is a disease in which the bones lose minerals and strength with aging. This can result in serious bone fractures. Your risk for osteoporosis can be identified using a bone density scan.  If you are 50 years of age or older, or if you are at risk for osteoporosis and fractures, ask your health care provider if you should be screened.  Ask your health care provider whether you should take a calcium or vitamin D supplement to lower your risk for osteoporosis.  Menopause may have certain physical symptoms and risks.  Hormone replacement therapy may reduce some of these symptoms and risks. Talk to your health care provider about whether hormone replacement therapy is right for you.  HOME CARE INSTRUCTIONS   Schedule regular health, dental, and eye exams.  Stay current with your immunizations.   Do not use any tobacco products including cigarettes, chewing tobacco, or electronic cigarettes.  If you are pregnant, do not drink alcohol.  If you are breastfeeding, limit how much and how often you drink alcohol.  Limit alcohol intake to no more than 1 drink per day for nonpregnant women. One drink equals 12 ounces of beer, 5 ounces of wine, or 1 ounces of hard liquor.  Do not use street drugs.  Do not share needles.  Ask your health care provider for help if you need support or information about quitting drugs.  Tell your health care provider if you often feel depressed.  Tell your health care provider if you have ever been abused or do not feel safe at home. Document Released: 01/29/2011  Document Revised: 11/30/2013 Document Reviewed: 06/17/2013 [facility] Patient Information 2015 [place], [place]. This information is not intended to replace advice given to you by your health care provider. Make sure you discuss any questions you have with your health care provider. Carpal Tunnel Syndrome The carpal tunnel is a narrow area located on the palm side of your wrist. The tunnel is formed by the wrist bones and ligaments. Nerves, blood vessels, and tendons pass through the carpal tunnel. Repeated wrist motion or certain diseases may cause swelling within the tunnel. This swelling pinches the main nerve in the wrist (median nerve) and causes the painful hand and arm condition called carpal tunnel syndrome. CAUSES   Repeated wrist motions.  Wrist injuries.  Certain diseases like arthritis, diabetes, alcoholism, hyperthyroidism, and kidney failure.  Obesity.  Pregnancy. SYMPTOMS   A "pins and needles" feeling in your fingers or hand, especially in your thumb, index and middle fingers.  Tingling or numbness in your fingers or hand.  An aching feeling in your entire arm, especially when your wrist and elbow are bent for long periods of time.  Wrist pain that goes up your arm to your shoulder.  Pain that goes down into your palm or fingers.  A weak feeling in your hands. DIAGNOSIS  Your health care provider will take your history and perform a physical exam. An electromyography test may be needed. This test measures electrical signals sent out by your nerves into the muscles. The electrical signals are usually slowed by carpal tunnel syndrome. You may also need X-rays. TREATMENT  Carpal tunnel syndrome may clear up by itself. Your health care provider may recommend a wrist splint or medicine such as a nonsteroidal anti-inflammatory medicine. Cortisone injections may help. Sometimes, surgery may be needed to free the pinched nerve.  HOME CARE INSTRUCTIONS   Take all medicine  as  directed by your health care provider. Only take over-the-counter or prescription medicines for pain, discomfort, or fever as directed by your health care provider.  If you were given a splint to keep your wrist from bending, wear it as directed. It is important to wear the splint at night. Wear the splint for as long as you have pain or numbness in your hand, arm, or wrist. This may take 1 to 2 months.  Rest your wrist from any activity that may be causing your pain. If your symptoms are work-related, you may need to talk to your employer about changing to a job that does not require using your wrist.  Put ice on your wrist after long periods of wrist activity.  Put ice in a plastic bag.  Place a towel between your skin and the bag.  Leave the ice on for 15-20 minutes, 03-04 times a day.  Keep all follow-up visits as directed by your health care provider. This includes any orthopedic referrals, physical therapy, and rehabilitation. Any delay in getting necessary care could result in a delay or failure of your condition to heal. SEEK IMMEDIATE MEDICAL CARE IF:   You have new, unexplained symptoms.  Your symptoms get worse and are not helped or controlled with medicines. MAKE SURE YOU:   Understand these instructions.  Will watch your condition.  Will get help right away if you are not doing well or get worse. Document Released: 07/13/2000 Document Revised: 11/30/2013 Document Reviewed: 06/01/2011 [facility] Patient Information 2015 [place], [place]. This information is not intended to replace advice given to you by your health care provider. Make sure you discuss any questions you have with your health care provider.

## 2014-03-26 NOTE — Progress Notes (Signed)
   Subjective:    Patient ID: Julia Bean, female    DOB: 11-Mar-1966, 48 y.o.   MRN: [id number]  HPI Pt presents to the office for annual with pap. Pt currently not taking any medications for any chronic illness. PT only complains today of left wrist pain and states she has hx carpal tunnel. Pt states she wears a brace all day at work with no relief.    Review of Systems  Constitutional: Negative.   HENT: Negative.   Eyes: Negative.   Respiratory: Negative.  Negative for shortness of breath.   Cardiovascular: Negative.  Negative for palpitations.  Gastrointestinal: Negative.   Endocrine: Negative.   Genitourinary: Negative.   Musculoskeletal: Negative.   Neurological: Negative.  Negative for headaches.  Hematological: Negative.   Psychiatric/Behavioral: Negative.   All other systems reviewed and are negative.      Objective:   Physical Exam  Vitals reviewed. Constitutional: She is oriented to person, place, and time. She appears well-developed and well-nourished. No distress.  HENT:  Head: Normocephalic and atraumatic.  Right Ear: External ear normal.  Mouth/Throat: Oropharynx is clear and moist.  Eyes: Pupils are equal, round, and reactive to light.  Neck: Normal range of motion. Neck supple. No thyromegaly present.  Cardiovascular: Normal rate, regular rhythm, normal heart sounds and intact distal pulses.   No murmur heard. Pulmonary/Chest: Effort normal and breath sounds normal. No respiratory distress. She has no wheezes. Right breast exhibits no inverted nipple, no mass, no nipple discharge, no skin change and no tenderness. Left breast exhibits no inverted nipple, no mass, no nipple discharge, no skin change and no tenderness. Breasts are symmetrical.  Abdominal: Soft. Bowel sounds are normal. She exhibits no distension. There is no tenderness.  Genitourinary: Vagina normal and uterus normal.  Bimanual exam- no adnexal masses or tenderness, ovaries nonpalpable    Cervix parous and pink- No discharge   Musculoskeletal: Normal range of motion. She exhibits no edema and no tenderness.  Neurological: She is alert and oriented to person, place, and time. She has normal reflexes. No cranial nerve deficit.  Skin: Skin is warm and dry.  Psychiatric: She has a normal mood and affect. Her behavior is normal. Judgment and thought content normal.      BP 114/70  Pulse 63  Temp(Src) 98.4 F (36.9 C) (Oral)  Ht $R'5\' 11"'XC$  (1.803 m)  Wt 197 lb 6.4 oz (89.54 kg)  BMI 27.54 kg/m2     Assessment & Plan:  1. Annual physical exam - POCT urinalysis dipstick - POCT UA - Microscopic Only - CMP14+EGFR - Lipid panel - Vit D  25 hydroxy (rtn osteoporosis monitoring) - Thyroid Panel With TSH - Pap IG w/ reflex to HPV when ASC-U  2. Encounter for routine gynecological examination - Pap IG w/ reflex to HPV when ASC-U  3. Carpal tunnel syndrome of left wrist - Ambulatory referral to Neurosurgery -Continue to wear brace while working  Continue all meds Labs pending Health Maintenance reviewed Diet and exercise encouraged RTO 1 year  Evelina Dun, [place]

## 2014-03-27 LAB — LIPID PANEL
CHOL/HDL RATIO: 2.4 ratio (ref 0.0–4.4)
Cholesterol, Total: 169 mg/dL (ref 100–199)
HDL: 70 mg/dL (ref >39)
LDL CALC: 87 mg/dL (ref 0–99)
TRIGLYCERIDES: 59 mg/dL (ref 0–149)
VLDL Cholesterol Cal: 12 mg/dL (ref 5–40)

## 2014-03-27 LAB — CMP14+EGFR
A/G RATIO: 1.8 (ref 1.1–2.5)
ALBUMIN: 4.4 g/dL (ref 3.5–5.5)
ALK PHOS: 81 IU/L (ref 39–117)
ALT: 28 IU/L (ref 0–32)
AST: 24 IU/L (ref 0–40)
BUN / CREAT RATIO: 14 (ref 9–23)
BUN: 10 mg/dL (ref 6–24)
CALCIUM: 9.4 mg/dL (ref 8.7–10.2)
CO2: 26 mmol/L (ref 18–29)
CREATININE: 0.71 mg/dL (ref 0.57–1.00)
Chloride: 100 mmol/L (ref 97–108)
GFR calc Af Amer: 117 mL/min/{1.73_m2} (ref >59)
GFR, EST NON AFRICAN AMERICAN: 102 mL/min/{1.73_m2} (ref >59)
GLOBULIN, TOTAL: 2.5 g/dL (ref 1.5–4.5)
Glucose: 78 mg/dL (ref 65–99)
Potassium: 4.7 mmol/L (ref 3.5–5.2)
SODIUM: 141 mmol/L (ref 134–144)
Total Bilirubin: 0.6 mg/dL (ref 0.0–1.2)
Total Protein: 6.9 g/dL (ref 6.0–8.5)

## 2014-03-27 LAB — THYROID PANEL WITH TSH
Free Thyroxine Index: 1.7 (ref 1.2–4.9)
T3 Uptake Ratio: 26 % (ref 24–39)
T4, Total: 6.7 ug/dL (ref 4.5–12.0)
TSH: 1.6 u[IU]/mL (ref 0.450–4.500)

## 2014-03-27 LAB — VITAMIN D 25 HYDROXY (VIT D DEFICIENCY, FRACTURES): VIT D 25 HYDROXY: 38.9 ng/mL (ref 30.0–100.0)

## 2014-03-29 LAB — PAP IG W/ RFLX HPV ASCU: PAP SMEAR COMMENT: 0

## 2014-04-09 ENCOUNTER — Ambulatory Visit (HOSPITAL_COMMUNITY)
Admission: RE | Admit: 2014-04-09 | Discharge: 2014-04-09 | Disposition: A | Discharge status: Home / Self Care | Payer: BC Managed Care – PPO | Source: Ambulatory Visit | Attending: Family | Admitting: Family

## 2014-04-09 ENCOUNTER — Telehealth: Payer: Self-pay | Admitting: Family

## 2014-04-09 DIAGNOSIS — Z1231 Encounter for screening mammogram for malignant neoplasm of breast: Secondary | ICD-10-CM | POA: Diagnosis not present

## 2014-04-09 NOTE — Telephone Encounter (Signed)
Discussed normal results with patient.  

## 2014-10-21 ENCOUNTER — Emergency Department (HOSPITAL_COMMUNITY)
Admission: EM | Admit: 2014-10-21 | Discharge: 2014-10-21 | Disposition: A | Discharge status: Home / Self Care | Payer: Worker's Compensation | Attending: Emergency Medicine | Admitting: Emergency Medicine

## 2014-10-21 ENCOUNTER — Emergency Department (HOSPITAL_COMMUNITY): Payer: Worker's Compensation

## 2014-10-21 ENCOUNTER — Encounter (HOSPITAL_COMMUNITY): Payer: Self-pay | Admitting: Emergency Medicine

## 2014-10-21 DIAGNOSIS — Y9389 Activity, other specified: Secondary | ICD-10-CM | POA: Diagnosis not present

## 2014-10-21 DIAGNOSIS — Z720 Tobacco use: Secondary | ICD-10-CM | POA: Insufficient documentation

## 2014-10-21 DIAGNOSIS — Y9289 Other specified places as the place of occurrence of the external cause: Secondary | ICD-10-CM | POA: Insufficient documentation

## 2014-10-21 DIAGNOSIS — X58XXXA Exposure to other specified factors, initial encounter: Secondary | ICD-10-CM | POA: Diagnosis not present

## 2014-10-21 DIAGNOSIS — Z791 Long term (current) use of non-steroidal anti-inflammatories (NSAID): Secondary | ICD-10-CM | POA: Insufficient documentation

## 2014-10-21 DIAGNOSIS — R52 Pain, unspecified: Secondary | ICD-10-CM

## 2014-10-21 DIAGNOSIS — S8992XA Unspecified injury of left lower leg, initial encounter: Secondary | ICD-10-CM | POA: Diagnosis not present

## 2014-10-21 DIAGNOSIS — Z79899 Other long term (current) drug therapy: Secondary | ICD-10-CM | POA: Insufficient documentation

## 2014-10-21 DIAGNOSIS — Y990 Civilian activity done for income or pay: Secondary | ICD-10-CM | POA: Diagnosis not present

## 2014-10-21 DIAGNOSIS — Z8719 Personal history of other diseases of the digestive system: Secondary | ICD-10-CM | POA: Insufficient documentation

## 2014-10-21 DIAGNOSIS — M25562 Pain in left knee: Secondary | ICD-10-CM

## 2014-10-21 MED ORDER — IBUPROFEN 800 MG PO TABS: 800.0000 mg | ORAL_TABLET | Freq: Three times a day (TID) | ORAL | Status: DC | PRN

## 2014-10-21 NOTE — Discharge Instructions (Signed)
Read the information below.  Use the prescribed medication as directed.  Please discuss all new medications with your pharmacist.  You may return to the Emergency Department at any time for worsening condition or any new symptoms that concern you.  If you develop uncontrolled pain, weakness or numbness of the extremity, severe discoloration of the skin, or you are unable to move your knee or walk, return to the ER for a recheck.    ° ° °Knee Pain °The knee is the complex joint between your thigh and your lower leg. It is made up of bones, tendons, ligaments, and cartilage. The bones that make up the knee are: °· The femur in the thigh. °· The tibia and fibula in the lower leg. °· The patella or kneecap riding in the groove on the lower femur. °CAUSES  °Knee pain is a common complaint with many causes. A few of these causes are: °· Injury, such as: °¨ A ruptured ligament or tendon injury. °¨ Torn cartilage. °· Medical conditions, such as: °¨ Gout °¨ Arthritis °¨ Infections °· Overuse, over training, or overdoing a physical activity. °Knee pain can be minor or severe. Knee pain can accompany debilitating injury. Minor knee problems often respond well to self-care measures or get well on their own. More serious injuries may need medical intervention or even surgery. °SYMPTOMS °The knee is complex. Symptoms of knee problems can vary widely. Some of the problems are: °· Pain with movement and weight bearing. °· Swelling and tenderness. °· Buckling of the knee. °· Inability to straighten or extend your knee. °· Your knee locks and you cannot straighten it. °· Warmth and redness with pain and fever. °· Deformity or dislocation of the kneecap. °DIAGNOSIS  °Determining what is wrong may be very straight forward such as when there is an injury. It can also be challenging because of the complexity of the knee. Tests to make a diagnosis may include: °· Your caregiver taking a history and doing a physical exam. °· Routine X-rays  can be used to rule out other problems. X-rays will not reveal a cartilage tear. Some injuries of the knee can be diagnosed by: °· Arthroscopy a surgical technique by which a small video camera is inserted through tiny incisions on the sides of the knee. This procedure is used to examine and repair internal knee joint problems. Tiny instruments can be used during arthroscopy to repair the torn knee cartilage (meniscus). °· Arthrography is a radiology technique. A contrast liquid is directly injected into the knee joint. Internal structures of the knee joint then become visible on X-ray film. °· An MRI scan is a non X-ray radiology procedure in which magnetic fields and a computer produce two- or three-dimensional images of the inside of the knee. Cartilage tears are often visible using an MRI scanner. MRI scans have largely replaced arthrography in diagnosing cartilage tears of the knee. °· Blood work. °· Examination of the fluid that helps to lubricate the knee joint (synovial fluid). This is done by taking a sample out using a needle and a syringe. °TREATMENT °The treatment of knee problems depends on the cause. Some of these treatments are: °· Depending on the injury, proper casting, splinting, surgery, or physical therapy care will be needed. °· Give yourself adequate recovery time. Do not overuse your joints. If you begin to get sore during workout routines, back off. Slow down or do fewer repetitions. °· For repetitive activities such as cycling or running, maintain your strength and   nutrition. °· Alternate muscle groups. For example, if you are a weight lifter, work the upper body on one day and the lower body the next. °· Either tight or weak muscles do not give the proper support for your knee. Tight or weak muscles do not absorb the stress placed on the knee joint. Keep the muscles surrounding the knee strong. °· Take care of mechanical problems. °· If you have flat feet, orthotics or special shoes may  help. See your caregiver if you need help. °· Arch supports, sometimes with wedges on the inner or outer aspect of the heel, can help. These can shift pressure away from the side of the knee most bothered by osteoarthritis. °· A brace called an "unloader" brace also may be used to help ease the pressure on the most arthritic side of the knee. °· If your caregiver has prescribed crutches, braces, wraps or ice, use as directed. The acronym for this is PRICE. This means protection, rest, ice, compression, and elevation. °· Nonsteroidal anti-inflammatory drugs (NSAIDs), can help relieve pain. But if taken immediately after an injury, they may actually increase swelling. Take NSAIDs with food in your stomach. Stop them if you develop stomach problems. Do not take these if you have a history of ulcers, stomach pain, or bleeding from the bowel. Do not take without your caregiver's approval if you have problems with fluid retention, heart failure, or kidney problems. °· For ongoing knee problems, physical therapy may be helpful. °· Glucosamine and chondroitin are over-the-counter dietary supplements. Both may help relieve the pain of osteoarthritis in the knee. These medicines are different from the usual anti-inflammatory drugs. Glucosamine may decrease the rate of cartilage destruction. °· Injections of a corticosteroid drug into your knee joint may help reduce the symptoms of an arthritis flare-up. They may provide pain relief that lasts a few months. You may have to wait a few months between injections. The injections do have a small increased risk of infection, water retention, and elevated blood sugar levels. °· Hyaluronic acid injected into damaged joints may ease pain and provide lubrication. These injections may work by reducing inflammation. A series of shots may give relief for as long as 6 months. °· Topical painkillers. Applying certain ointments to your skin may help relieve the pain and stiffness of  osteoarthritis. Ask your pharmacist for suggestions. Many over the-counter products are approved for temporary relief of arthritis pain. °· In some countries, doctors often prescribe topical NSAIDs for relief of chronic conditions such as arthritis and tendinitis. A review of treatment with NSAID creams found that they worked as well as oral medications but without the serious side effects. °PREVENTION °· Maintain a healthy weight. Extra pounds put more strain on your joints. °· Get strong, stay limber. Weak muscles are a common cause of knee injuries. Stretching is important. Include flexibility exercises in your workouts. °· Be smart about exercise. If you have osteoarthritis, chronic knee pain or recurring injuries, you may need to change the way you exercise. This does not mean you have to stop being active. If your knees ache after jogging or playing basketball, consider switching to swimming, water aerobics, or other low-impact activities, at least for a few days a week. Sometimes limiting high-impact activities will provide relief. °· Make sure your shoes fit well. Choose footwear that is right for your sport. °· Protect your knees. Use the proper gear for knee-sensitive activities. Use kneepads when playing volleyball or laying carpet. Buckle your seat belt every   time you drive. Most shattered kneecaps occur in car accidents. °· Rest when you are tired. °SEEK MEDICAL CARE IF:  °You have knee pain that is continual and does not seem to be getting better.  °SEEK IMMEDIATE MEDICAL CARE IF:  °Your knee joint feels hot to the touch and you have a high fever. °MAKE SURE YOU:  °· Understand these instructions. °· Will watch your condition. °· Will get help right away if you are not doing well or get worse. °Document Released: 05/13/2007 Document Revised: 10/08/2011 Document Reviewed: 05/13/2007 °Exit[employer]® Patient Information ©2015 [employer]. This information is not intended to replace advice given to you by  your health care provider. Make sure you discuss any questions you have with your health care provider. ° °Knee Bracing °Knee braces are supports to help stabilize and protect an injured or painful knee. They come in many different styles. They should support and protect the knee without increasing the chance of other injuries to yourself or others. It is important not to have a false sense of security when using a brace. Knee braces that help you to keep using your knee: °· Do not restore normal knee stability under high stress forces. °· May decrease some aspects of athletic performance. °Some of the different types of knee braces are: °· Prophylactic knee braces are designed to prevent or reduce the severity of knee injuries during sports that make injury to the knee more likely. °· Rehabilitative knee braces are designed to allow protected motion of: °¨ Injured knees. °¨ Knees that have been treated with or without surgery. °There is no evidence that the use of a supportive knee brace protects the graft following a successful anterior cruciate ligament (ACL) reconstruction. However, braces are sometimes used to:  °· Protect injured ligaments. °· Control knee movement during the initial healing period. °They may be used as part of the treatment program for the various injured ligaments or cartilage of the knee including the: °· Anterior cruciate ligament. °· Medial collateral ligament. °· Medial or lateral cartilage (meniscus). °· Posterior cruciate ligament. °· Lateral collateral ligament. °Rehabilitative knee braces are most commonly used: °· During crutch-assisted walking right after injury. °· During crutch-assisted walking right after surgery to repair the cartilage and/or cruciate ligament injury. °· For a short period of time, 2-8 weeks, after the injury or surgery. °The value of a rehabilitative brace as opposed to a cast or splint includes the: °· Ability to adjust the brace for swelling. °· Ability to  remove the brace for examinations, icing, or showering. °· Ability to allow for movement in a controlled range of motion. °Functional knee braces give support to knees that have already been injured. They are designed to provide stability for the injured knee and provide protection after repair. Functional knee braces may not affect performance much. Lower extremity muscle strengthening, flexibility, and improvement in technique are more important than bracing in treating ligamentous knee injuries. Functional braces are not a substitute for rehabilitation or surgical procedures. °Unloader/off-loader braces are designed to provide pain relief in arthritic knees. Patients with wear and tear arthritis from growing old or from an old cartilage injury (osteoarthritis) of the knee, and bowlegged (varus) or knock-knee (valgus) deformities, often develop increased pain in the arthritic side due to increased loading. Unloader/off-loader braces are made to reduce uneven loading in such knees. There is reduction in bowing out movement in bowlegged knees when the correct unloader brace is used. Patients with advanced osteoarthritis or severe varus or   valgus alignment problems would not likely benefit from bracing. °Patellofemoral braces help the kneecap to move smoothly and well centered over the end of the femur in the knee.  °Most people who wear knee braces feel that they help. However, there is a lack of scientific evidence that knee braces are helpful at the level needed for athletic participation to prevent injury. In spite of this, athletes report an increase in knee stability, pain relief, performance improvement, and confidence during athletics when using a brace.  °Different knee problems require different knee braces: °· Your caregiver may suggest one kind of knee brace after knee surgery. °· A caregiver may choose another kind of knee brace for support instead of surgery for some types of torn ligaments. °· You may  also need one for pain in the front of your knee that is not getting better with strengthening and flexibility exercises. °Get your caregiver's advice if you want to try a knee brace. The caregiver will advise you on where to get them and provide a prescription when it is needed to fashion and/or fit the brace. °Knee braces are the least important part of preventing knee injuries or getting better following injury. Stretching, strengthening and technique improvement are far more important in caring for and preventing knee injuries. When strengthening your knee, increase your activities a little at a time so as not to develop injuries from overuse. Work out an exercise plan with your caregiver and/or physical therapist to get the best program for you. Do not let a knee brace become a crutch. °Always remember, there are no braces which support the knee as well as your original ligaments and cartilage you were born with. Conditioning, proper warm-up, and stretching remain the most important parts of keeping your knees healthy. °HOW TO USE A KNEE BRACE °· During sports, knee braces should be used as directed by your caregiver. °· Make sure that the hinges are where the knee bends. °· Straps, tapes, or hook-and-loop tapes should be fastened around your leg as instructed. °· You should check the placement of the brace during activities to make sure that it has not moved. Poorly positioned braces can hurt rather than help you. °· To work well, a knee brace should be worn during all activities that put you at risk of knee injury. °· Warm up properly before beginning athletic activities. °HOME CARE INSTRUCTIONS °· Knee braces often get damaged during normal use. Replace worn-out braces for maximum benefit. °· Clean regularly with soap and water. °· Inspect your brace often for wear and tear. °· Cover exposed metal to protect others from injury. °· Durable materials may cost more, but last longer. °SEEK IMMEDIATE MEDICAL CARE  IF:  °· Your knee seems to be getting worse rather than better. °· You have increasing pain or swelling in the knee. °· You have problems caused by the knee brace. °· You have increased swelling or inflammation (redness or soreness) in your knee. °· Your knee becomes warm and more painful and you develop an unexplained temperature over 101°F (38.3°C). °MAKE SURE YOU:  °· Understand these instructions. °· Will watch your condition. °· Will get help right away if you are not doing well or get worse. °See your caregiver, physical therapist, or orthopedic surgeon for additional information. °Document Released: 10/06/2003 Document Revised: 11/30/2013 Document Reviewed: 01/12/2009 °ExitCare® Patient Information ©2015 [employer]. This information is not intended to replace advice given to you by your health care provider. Make sure you discuss any   discuss any questions you have with your health care provider. ° °

## 2014-10-21 NOTE — ED Provider Notes (Signed)
CSN: [id number]     Arrival date & time 10/21/14  1203 History  This chart was scribed for non-physician practitioner Trixie Dredge working with Azalia Bilis, MD by Carl Best, ED Scribe. This patient was seen in room WTR9/WTR9 and the patient's care was started at 12:32 PM.    Chief Complaint  Patient presents with  . Knee Pain   The history is provided by the patient. No language interpreter was used.   HPI Comments: Julia Bean is a 49 y.o. female who presents to the Emergency Department complaining of constant left knee pain that started this afternoon PTA after he knee gave out on her at work.  She states that her kneecap moved out of place during the incident.  States she felt her kneecap shift, and shift back, then felt that she could not walk because of the pain in her knee.  She has a history of cartilage repair in her left knee.  Denies fevers, weakness, numbness, other injury.    Past Medical History  Diagnosis Date  . GERD (gastroesophageal reflux disease)     from Keflex   Past Surgical History  Procedure Laterality Date  . Knee surgery    . Cesarean section    . Open reduction internal fixation (orif) metacarpal  07/25/2012    Procedure: OPEN REDUCTION INTERNAL FIXATION (ORIF) METACARPAL;  Surgeon: [name], MD;  Location: [facility] OR;  Service: Orthopedics;  Laterality: Right;  Open Reduction Internal Fixation Right Third Finger  . Open reduction internal fixation (orif) metacarpal Right 09/04/2012    Procedure: OPEN REDUCTION INTERNAL FIXATION (ORIF) METACARPAL;  Surgeon: [name], MD;  Location: [facility] OR;  Service: Orthopedics;  Laterality: Right;  ORIF MIDDLE PHALANX FRACTURE AND DEBRIDEMENT   Family History  Problem Relation Age of Onset  . Diabetes Mother   . Cancer Father    History  Substance Use Topics  . Smoking status: Current Every Day Smoker -- 1.00 packs/day for 20 years    Types: Cigarettes  . Smokeless tobacco: Never Used  . Alcohol Use: No      Comment: Occ   OB History    No data available     Review of Systems  Constitutional: Negative for fever and chills.  Musculoskeletal: Positive for arthralgias and gait problem.  Skin: Negative for color change and wound.  Allergic/Immunologic: Negative for immunocompromised state.  Neurological: Negative for weakness and numbness.  Hematological: Does not bruise/bleed easily.  Psychiatric/Behavioral: Negative for self-injury.  All other systems reviewed and are negative.   Allergies  Clindamycin/lincomycin  Home Medications   Prior to Admission medications   Medication Sig Start Date End Date Taking? Authorizing Provider  Ibuprofen (ADVIL PO) Take by mouth.    Historical Provider, MD  meloxicam (MOBIC) 15 MG tablet Take 1 tablet (15 mg total) by mouth daily. 03/04/14   Junie Spencer, FNP  Multiple Vitamins-Minerals (MULTIVITAMINS THER. W/MINERALS) TABS Take 1 tablet by mouth daily.    Historical Provider, MD   Triage Vitals: BP 122/78 mmHg  Pulse 75  Resp 18  SpO2 97%  LMP 09/02/2014 (Approximate)   Physical Exam  Constitutional: She appears well-developed and well-nourished. No distress.  HENT:  Head: Normocephalic and atraumatic.  Neck: Neck supple.  Cardiovascular: Intact distal pulses.   Pulmonary/Chest: Effort normal.  Musculoskeletal:  Full flexion and extension of the left knee.  Swelling and tenderness over the medial knee.  No joint effusion.  No erythema, edema, warmth.  Calf is  soft, nontender.  Distal sensation pulses intact.  No laxity or pain with anterior and posterior valgus and varus testing.   No other bony tenderness.    Neurological: She is alert.  Skin: She is not diaphoretic.  Nursing note and vitals reviewed.   ED Course  Procedures (including critical care time)  DIAGNOSTIC STUDIES: Oxygen Saturation is 97% on room air, normal by my interpretation.    COORDINATION OF CARE: 12:37 PM- Will obtain an xray of the patient's left knee and the  patient agreed to the treatment plan. 2:02 PM- Examined patient's left knee.  Waiting for patient's xray to result.  The patient agreed to the treatment plan.   Labs Review Labs Reviewed - No data to display  Imaging Review Dg Knee Complete 4 Views Left  10/21/2014   CLINICAL DATA:  Injury at work with knee pain, initial encounter  EXAM: LEFT KNEE - COMPLETE 4+ VIEW  COMPARISON:  03/13/2013  FINDINGS: Mild medial joint space narrowing is noted. No acute fracture or dislocation is seen. No joint effusion is noted.  IMPRESSION: Broad degenerative changes medially.  No acute abnormality is noted.   Electronically Signed   By: Alcide CleverMark  Lukens M.D.   On: 10/21/2014 13:51     EKG Interpretation None      MDM   Final diagnoses:  Pain  Left knee pain    Afebrile, nontoxic patient with injury to her left knee while working.  Felt "kneecap shift" - patella is located appropriately and pt has full AROM and neurovascularly intact.  Xray shows degenerative changes only.  Knee sleeve, offered crutches.  D/C home with ibuprofen, orthopedic follow up.  Discussed result, findings, treatment, and follow up  with patient.  Pt given return precautions.  Pt verbalizes understanding and agrees with plan.        Trixie Dredgemily Kien Mirsky, PA-C 10/21/14 1411  Azalia BilisKevin Campos, MD 10/21/14 [phone]

## 2014-10-21 NOTE — ED Notes (Signed)
Pt reports tat she felt her " left knee cap shift". Unable to flex without pain. Can walk with leg extended, minimal pain. Pt was moving heavy items at work when she felt pain. Pt is alert oriented and appropriate

## 2014-10-21 NOTE — ED Notes (Signed)
Pt presents via EMS with c/o left knee pain. Pt injured her knee at work after feeling her knee give out. Pt denies any head, neck, or back pain.

## 2014-10-27 ENCOUNTER — Other Ambulatory Visit (HOSPITAL_COMMUNITY): Payer: Self-pay | Admitting: Orthopaedic Surgery

## 2014-10-27 DIAGNOSIS — M25562 Pain in left knee: Secondary | ICD-10-CM

## 2014-11-05 ENCOUNTER — Ambulatory Visit (HOSPITAL_COMMUNITY): Payer: Medicaid Other

## 2015-04-22 ENCOUNTER — Encounter: Payer: Self-pay | Admitting: Physician Assistant

## 2015-04-22 ENCOUNTER — Ambulatory Visit (INDEPENDENT_AMBULATORY_CARE_PROVIDER_SITE_OTHER): Payer: Medicaid Other | Admitting: Physician Assistant

## 2015-04-22 VITALS — BP 106/69 | HR 65 | Temp 97.8°F | Ht 71.0 in | Wt 185.0 lb

## 2015-04-22 DIAGNOSIS — J309 Allergic rhinitis, unspecified: Secondary | ICD-10-CM | POA: Diagnosis not present

## 2015-04-22 MED ORDER — LORATADINE 10 MG PO TABS: 10.0000 mg | ORAL_TABLET | Freq: Every day | ORAL | Status: DC

## 2015-04-22 MED ORDER — FLUTICASONE PROPIONATE 50 MCG/ACT NA SUSP: 2.0000 | Freq: Every day | NASAL | Status: DC

## 2015-04-22 NOTE — Patient Instructions (Signed)
Over the counter plain musinex as directed  Lots of fluids  Rest

## 2015-04-22 NOTE — Progress Notes (Signed)
   Subjective:    Patient ID: Julia Bean, female    DOB: 10/31/1965, 49 y.o.   MRN: [id number]  HPI 49 y/o female presents with symptoms of runny nose, sore throat, nasal congestion, HA x 3 days. She has not tried any medications except Vit C. Her son also has similar symptoms.     Review of Systems  Constitutional: Negative.   HENT: Positive for congestion (nasal ), postnasal drip, rhinorrhea, sinus pressure, sneezing and sore throat. Negative for ear pain.   Eyes: Negative.   Respiratory: Positive for cough (intermittently productive ).   Cardiovascular: Negative.   Gastrointestinal: Negative.   Genitourinary: Negative.   Musculoskeletal: Negative.   Skin: Negative.   Neurological: Positive for headaches.       Objective:   Physical Exam  Constitutional: She is oriented to person, place, and time. She appears well-developed and well-nourished. No distress.  HENT:  Head: Normocephalic and atraumatic.  Right Ear: External ear normal.  Left Ear: External ear normal.  Mouth/Throat: No oropharyngeal exudate.  Nasal turbinates erythematous bilaterally  Nasal congestion  Posterior pharynx erythema bilaterally   Cardiovascular: Normal rate and regular rhythm.  Exam reveals no gallop and no friction rub.   No murmur heard. Pulmonary/Chest: Effort normal and breath sounds normal. No respiratory distress. She has no wheezes. She has no rales. She exhibits no tenderness.  Neurological: She is alert and oriented to person, place, and time.  Skin: She is not diaphoretic.  Psychiatric: She has a normal mood and affect. Her behavior is normal. Judgment and thought content normal.  Nursing note and vitals reviewed.         Assessment & Plan:  1. Allergic rhinitis, unspecified allergic rhinitis type - otc plain musinex as directed  - fluticasone (FLONASE) 50 MCG/ACT nasal spray; Place 2 sprays into both nostrils daily.  Dispense: 16 g; Refill: 6 - loratadine (CLARITIN) 10 MG  tablet; Take 1 tablet (10 mg total) by mouth daily.  Dispense: 30 tablet; Refill: 11  RTo if s/s worsen or dni   Jillian Warth A. Chauncey Reading PA-C

## 2015-04-28 ENCOUNTER — Encounter: Payer: Self-pay | Admitting: Family Medicine

## 2015-04-28 ENCOUNTER — Ambulatory Visit (INDEPENDENT_AMBULATORY_CARE_PROVIDER_SITE_OTHER): Payer: Medicaid Other | Admitting: Family Medicine

## 2015-04-28 VITALS — BP 115/66 | HR 75 | Temp 97.1°F | Ht 71.0 in | Wt 185.0 lb

## 2015-04-28 DIAGNOSIS — J209 Acute bronchitis, unspecified: Secondary | ICD-10-CM

## 2015-04-28 MED ORDER — DOXYCYCLINE HYCLATE 100 MG PO TABS: 100.0000 mg | ORAL_TABLET | Freq: Two times a day (BID) | ORAL | Status: DC

## 2015-04-28 NOTE — Progress Notes (Signed)
   Subjective:    Patient ID: Julia Bean, female    DOB: 07-May-1966, 49 y.o.   MRN: [id number]  HPI Patient here today for continued cough and congestion that has gotten worse over the last week.  Patient is a smoker and has a history of bronchitis. At her last visit she was treated primarily for allergic rhinitis but since that visit she has developed more cough and congestion. Cough is productive of thick yellow mucus. It is associated with some shortness of breath and dyspnea and tightness. She has used some of her son and husband's albuterol inhaler when she got really tired or short of breath.     Patient Active Problem List   Diagnosis Date Noted  . Osteomyelitis of finger of right hand 01/29/2013   Outpatient Encounter Prescriptions as of 04/28/2015  Medication Sig  . fluticasone (FLONASE) 50 MCG/ACT nasal spray Place 2 sprays into both nostrils daily.  Marland Kitchen ibuprofen (ADVIL,MOTRIN) 800 MG tablet Take 1 tablet (800 mg total) by mouth every 8 (eight) hours as needed for mild pain or moderate pain.  Marland Kitchen loratadine (CLARITIN) 10 MG tablet Take 1 tablet (10 mg total) by mouth daily.  . Multiple Vitamins-Minerals (MULTIVITAMINS THER. W/MINERALS) TABS Take 1 tablet by mouth daily.   No facility-administered encounter medications on file as of 04/28/2015.      Review of Systems  Constitutional: Negative.   HENT: Positive for congestion (thick and yellow).   Eyes: Negative.   Respiratory: Positive for cough, chest tightness and shortness of breath.   Cardiovascular: Negative.   Gastrointestinal: Negative.   Endocrine: Negative.   Genitourinary: Negative.   Musculoskeletal: Negative.   Skin: Negative.   Allergic/Immunologic: Negative.   Neurological: Negative.   Hematological: Negative.   Psychiatric/Behavioral: Negative.        Objective:   Physical Exam  Constitutional: She appears well-developed and well-nourished.  HENT:  Head: Normocephalic.  Cardiovascular: Normal  rate and regular rhythm.   Pulmonary/Chest: Effort normal. She has wheezes.  Rhonchi present bilaterally   BP 115/66 mmHg  Pulse 75  Temp(Src) 97.1 F (36.2 C) (Oral)  Ht  (1.803 m)  Wt 185 lb (83.915 kg)  BMI 25.81 kg/m2        Assessment & Plan:   1. Acute bronchitis, unspecified organism Patient has history of bronchitis and is a smoker. I've asked her to try to cut down or quit cigarettes while she has this infection. Will treat with doxycycline. Also suggested holding Claritin as it may be counter productive to use the Mucinex. Drink plenty of fluids. She declined a prescription for albuterol since she has access to that in the house.  Frederica Kuster MD

## 2015-05-06 ENCOUNTER — Telehealth: Payer: Self-pay | Admitting: Family Medicine

## 2015-05-06 MED ORDER — SULFAMETHOXAZOLE-TRIMETHOPRIM 800-160 MG PO TABS: 1.0000 | ORAL_TABLET | Freq: Two times a day (BID) | ORAL | Status: DC

## 2015-05-06 NOTE — Telephone Encounter (Signed)
Septra sent to pharmacy. Patient aware. Also aware that she will need to be seen again if this antibiotic doesn't resolve her symptoms. She will continue mucinex and drink plenty of water.

## 2015-05-06 NOTE — Telephone Encounter (Signed)
Left message for patient to call back with symptoms and whether she is taking anything OTC. Finished 1 week of Doxycycline 2 days ago.

## 2015-05-06 NOTE — Telephone Encounter (Signed)
Documented in another encounter

## 2015-05-06 NOTE — Telephone Encounter (Signed)
Patient called back and said she is taking Mucinex now. She finished 7 day supply of doxycycline 2 days ago.  Productive cough and chest tightness. Still has some wheezing.  She would like either more doxycycline or a new antibiotic.

## 2015-05-06 NOTE — Telephone Encounter (Signed)
Start Septra DS No. 20 one twice daily with food, also take Mucinex maximum strength, blue and white in color, 1 twice daily with a large glass of water and drink plenty of fluids. If she is not better after doing this the patient needs to come back in for chest x-ray and reevaluation

## 2015-09-23 ENCOUNTER — Encounter: Payer: Self-pay | Admitting: Family Medicine

## 2015-09-23 ENCOUNTER — Ambulatory Visit: Payer: Self-pay | Admitting: Family Medicine

## 2015-09-23 VITALS — BP 110/61 | HR 85 | Temp 98.2°F | Ht 71.0 in | Wt 194.4 lb

## 2015-09-23 DIAGNOSIS — J209 Acute bronchitis, unspecified: Secondary | ICD-10-CM

## 2015-09-23 MED ORDER — LEVOFLOXACIN 500 MG PO TABS: 500.0000 mg | ORAL_TABLET | Freq: Every day | ORAL | Status: DC

## 2015-09-23 MED ORDER — PREDNISONE 20 MG PO TABS: 40.0000 mg | ORAL_TABLET | Freq: Every day | ORAL | Status: DC

## 2015-09-23 NOTE — Patient Instructions (Signed)
Great to meet you!  Come back or call with any questions  Acute Bronchitis Bronchitis is when the airways that extend from the windpipe into the lungs get red, puffy, and painful (inflamed). Bronchitis often causes thick spit (mucus) to develop. This leads to a cough. A cough is the most common symptom of bronchitis. In acute bronchitis, the condition usually begins suddenly and goes away over time (usually in 2 weeks). Smoking, allergies, and asthma can make bronchitis worse. Repeated episodes of bronchitis may cause more lung problems. HOME CARE  Rest.  Drink enough fluids to keep your pee (urine) clear or pale yellow (unless you need to limit fluids as told by your doctor).  Only take over-the-counter or prescription medicines as told by your doctor.  Avoid smoking and secondhand smoke. These can make bronchitis worse. If you are a smoker, think about using nicotine gum or skin patches. Quitting smoking will help your lungs heal faster.  Reduce the chance of getting bronchitis again by:  Washing your hands often.  Avoiding people with cold symptoms.  Trying not to touch your hands to your mouth, nose, or eyes.  Follow up with your doctor as told. GET HELP IF: Your symptoms do not improve after 1 week of treatment. Symptoms include:  Cough.  Fever.  Coughing up thick spit.  Body aches.  Chest congestion.  Chills.  Shortness of breath.  Sore throat. GET HELP RIGHT AWAY IF:   You have an increased fever.  You have chills.  You have severe shortness of breath.  You have bloody thick spit (sputum).  You throw up (vomit) often.  You lose too much body fluid (dehydration).  You have a severe headache.  You faint. MAKE SURE YOU:   Understand these instructions.  Will watch your condition.  Will get help right away if you are not doing well or get worse.   This information is not intended to replace advice given to you by your health care provider. Make  sure you discuss any questions you have with your health care provider.   Document Released: 01/02/2008 Document Revised: 03/18/2013 Document Reviewed: 01/06/2013 Elsevier Interactive Patient Education [employer]

## 2015-09-23 NOTE — Progress Notes (Signed)
   HPI  Patient presents today there is acute illness.  Patient explains her last 2 weeks she's been ill.  She's had cough, congestion, fatigue, headache off and ondescribed as frontal dull headache. She's hadmost predominantly nasal discharge and sinus congestion with sinus pressure. She is a smoker She's tried Mucinex without improvement She denies dyspnea or chest pain.  He also hasMalaise  PMH: Smoking status noted ROS: Per HPI  Objective: BP 110/61 mmHg  Pulse 85  Temp(Src) 98.2 F (36.8 C) (Oral)  Ht  (1.803 m)  Wt 194 lb 6.4 oz (88.179 kg)  BMI 27.13 kg/m2 Gen: NAD, alert, cooperative with exam HEENT: NCAT TMs normal bilaterally, oropharynx clear, nares clear CV: RRR, good S1/S2, no murmur Resp: CTABL, no wheezes, non-labored Ext: No edema, warm Neuro: Alert and oriented, No gross deficits  Assessment and plan:  # acute bronchitis Given duration of illness in smoking ahead and cover her with Levaquin for pneumonia also with severe sinus  Pressure and symptoms this will cover acute sinusitis as well. Prednisone course Discussed supportive care RTC with any concerns    Meds ordered this encounter  Medications  . levofloxacin (LEVAQUIN) 500 MG tablet    Sig: Take 1 tablet (500 mg total) by mouth daily.    Dispense:  7 tablet    Refill:  0  . predniSONE (DELTASONE) 20 MG tablet    Sig: Take 2 tablets (40 mg total) by mouth daily with breakfast.    Dispense:  14 tablet    Refill:  0    Murtis Sink, MD [facility] 09/23/2015, 2:58 PM

## 2018-10-07 ENCOUNTER — Other Ambulatory Visit (HOSPITAL_COMMUNITY): Payer: Self-pay | Admitting: *Deleted

## 2018-10-07 DIAGNOSIS — Z1231 Encounter for screening mammogram for malignant neoplasm of breast: Secondary | ICD-10-CM

## 2018-10-20 ENCOUNTER — Encounter (HOSPITAL_COMMUNITY): Payer: Self-pay

## 2018-10-20 ENCOUNTER — Ambulatory Visit (HOSPITAL_COMMUNITY): Payer: Self-pay

## 2018-11-24 ENCOUNTER — Ambulatory Visit (HOSPITAL_COMMUNITY): Payer: Self-pay

## 2019-01-12 ENCOUNTER — Ambulatory Visit (HOSPITAL_COMMUNITY)
Admission: RE | Admit: 2019-01-12 | Discharge: 2019-01-12 | Disposition: A | Discharge status: Home / Self Care | Payer: Self-pay | Source: Ambulatory Visit | Attending: *Deleted | Admitting: *Deleted

## 2019-01-12 ENCOUNTER — Other Ambulatory Visit (HOSPITAL_COMMUNITY): Payer: Self-pay | Admitting: *Deleted

## 2019-01-12 ENCOUNTER — Other Ambulatory Visit: Payer: Self-pay

## 2019-01-12 DIAGNOSIS — Z1231 Encounter for screening mammogram for malignant neoplasm of breast: Secondary | ICD-10-CM

## 2020-06-25 IMAGING — MG DIGITAL SCREENING BILATERAL MAMMOGRAM WITH TOMO AND CAD
8 series · 9 of 24 positions shown · non-contrast
Comparison: Previous exam(s).

CLINICAL DATA: Screening.

EXAM:
DIGITAL SCREENING BILATERAL MAMMOGRAM WITH TOMO AND CAD

[R CC synth-2D]
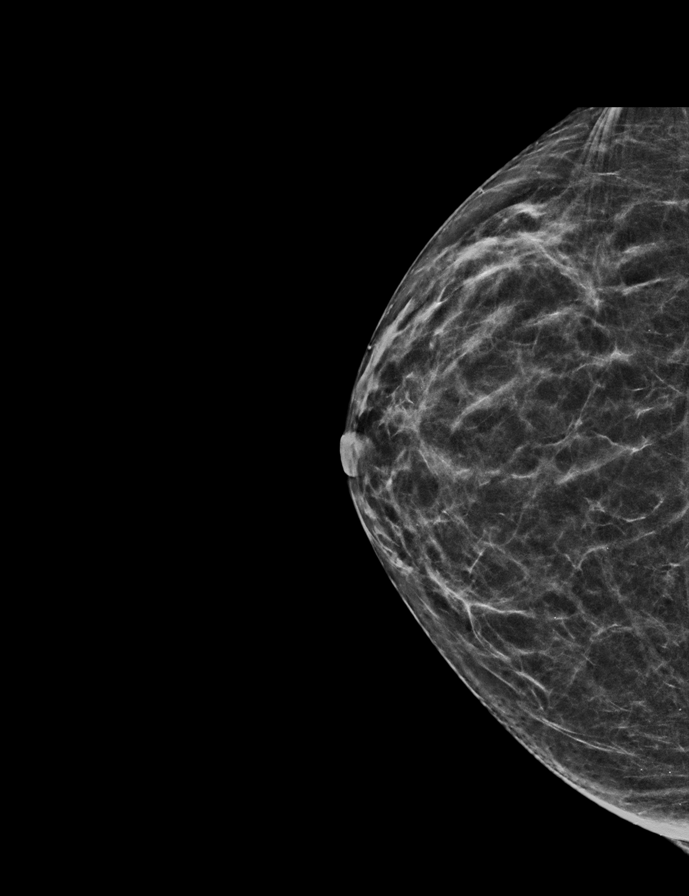

[L MLO synth-2D]
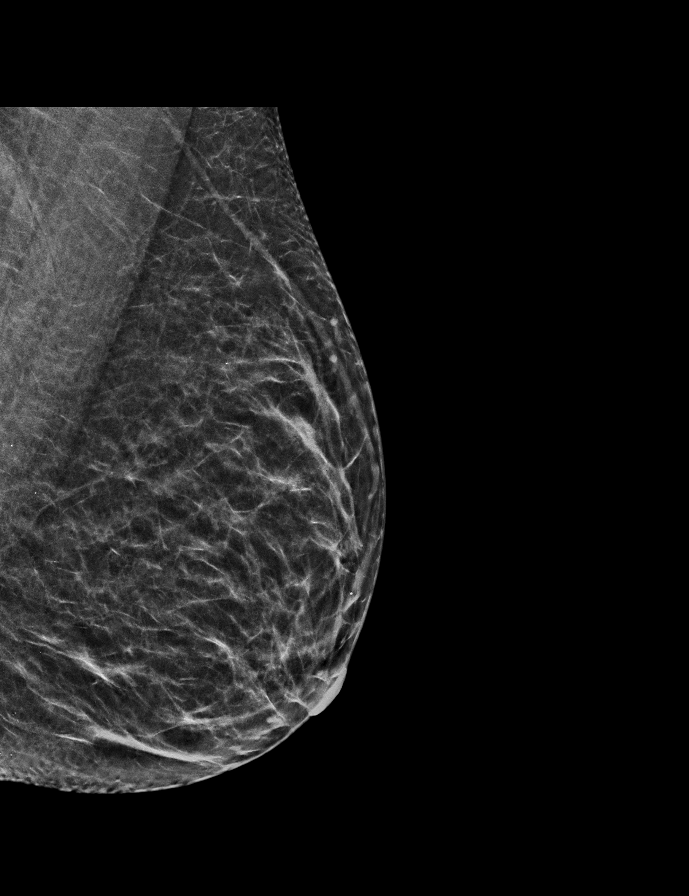

[R MLO synth-2D]
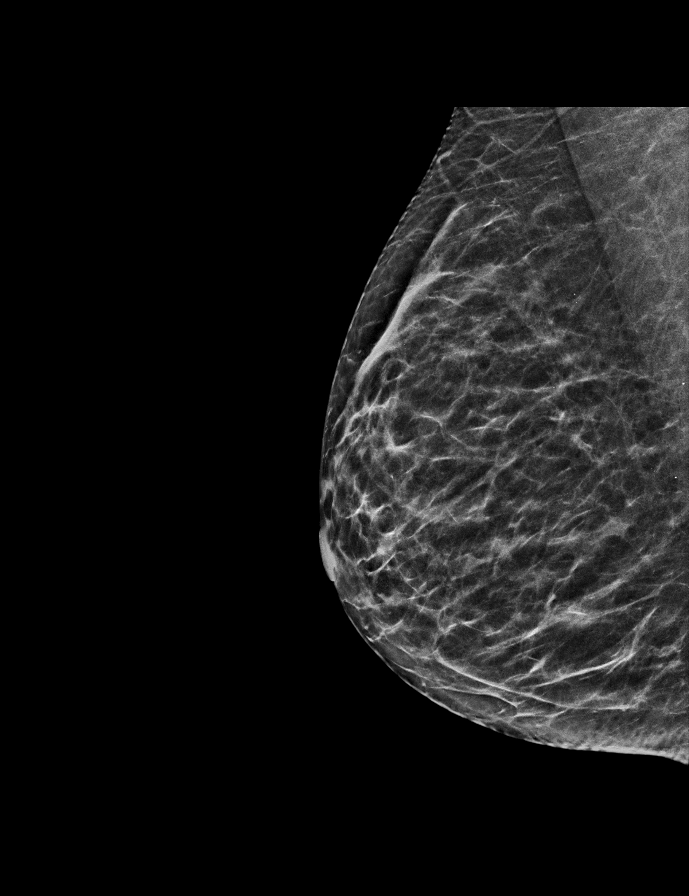

[L CC synth-2D]
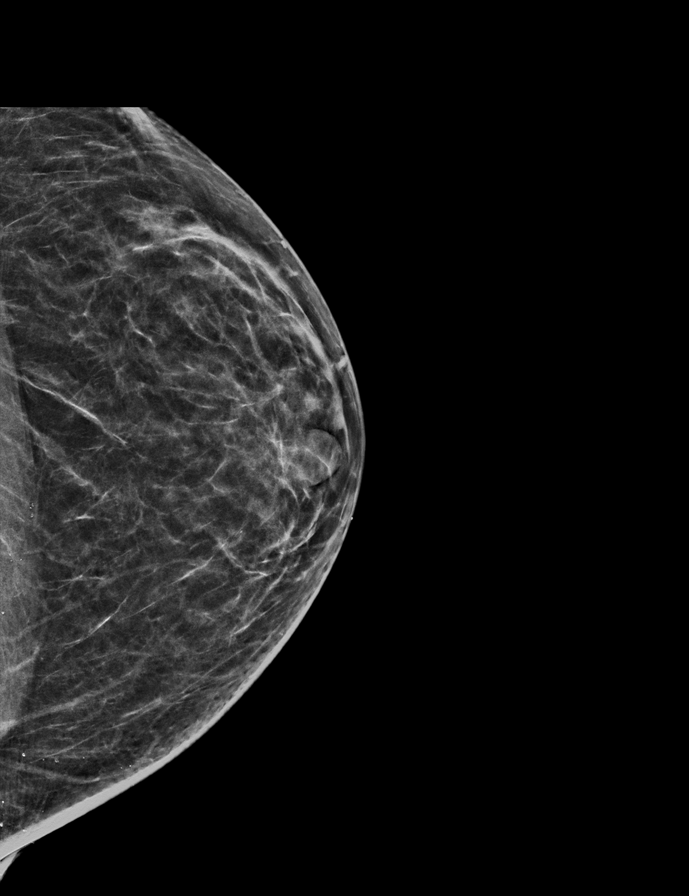

[L CC tomo · 2 of 55 frames shown]
[frame 18/55]
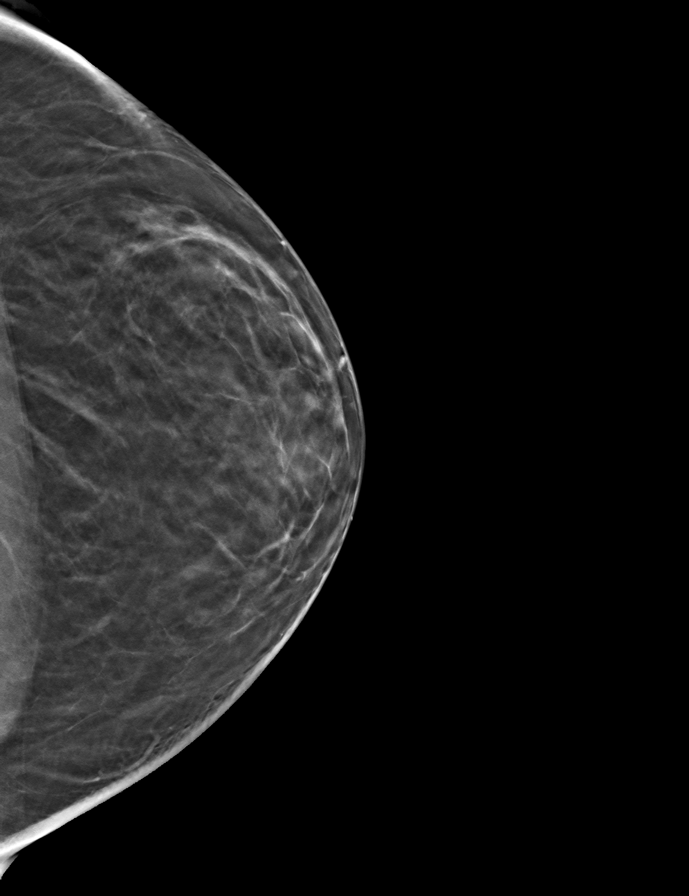
[frame 28/55]
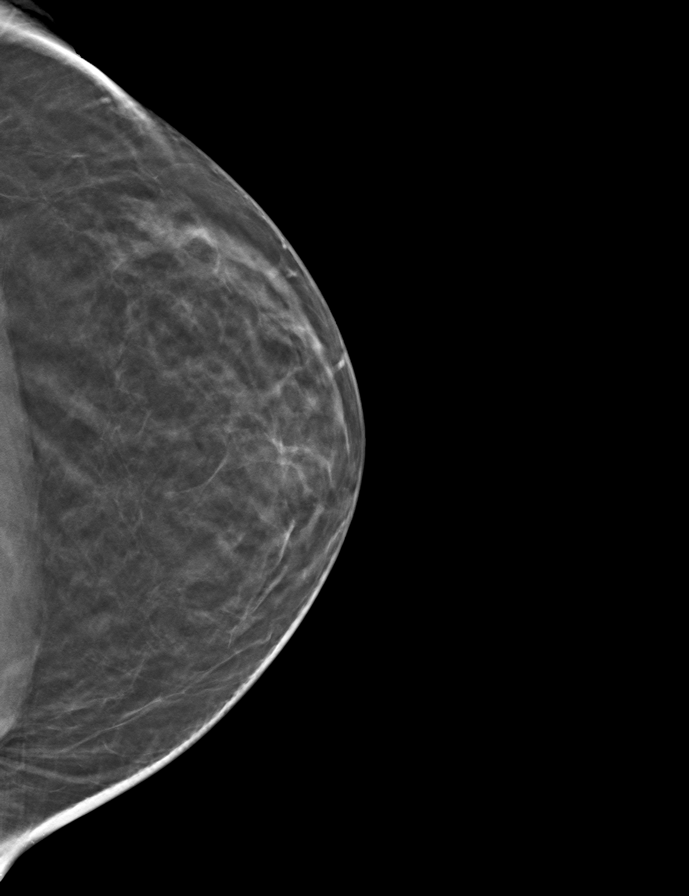

[L MLO tomo · tomo slice 26/51.0]
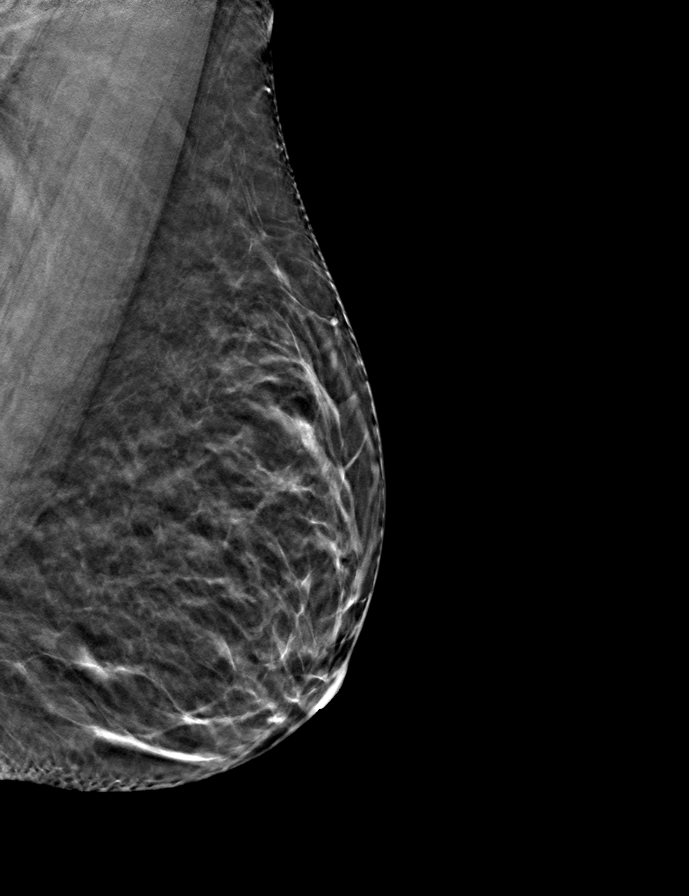

[R MLO tomo · tomo slice 25/50.0]
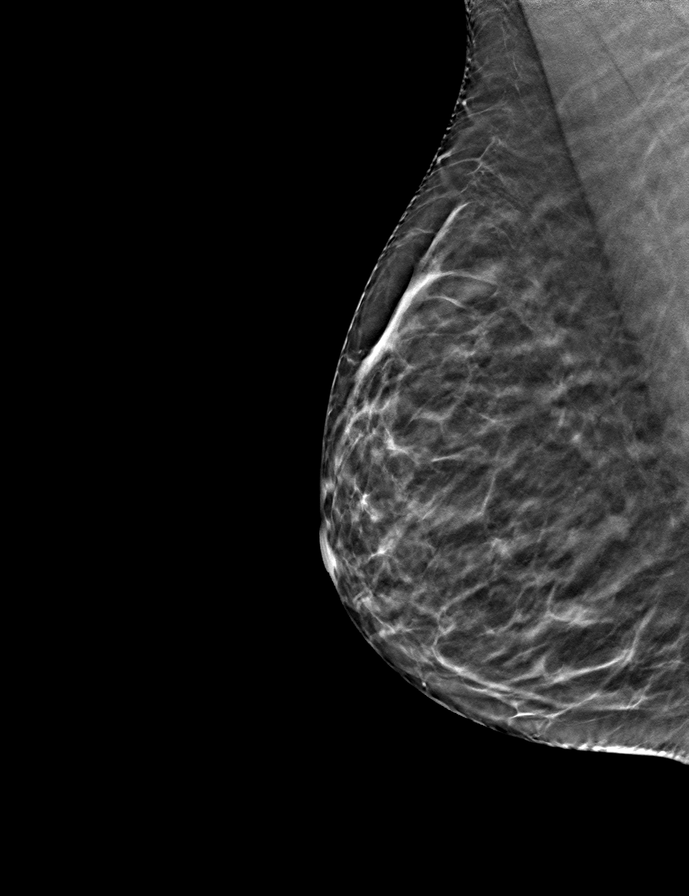

[R CC tomo · tomo slice 27/53.0]
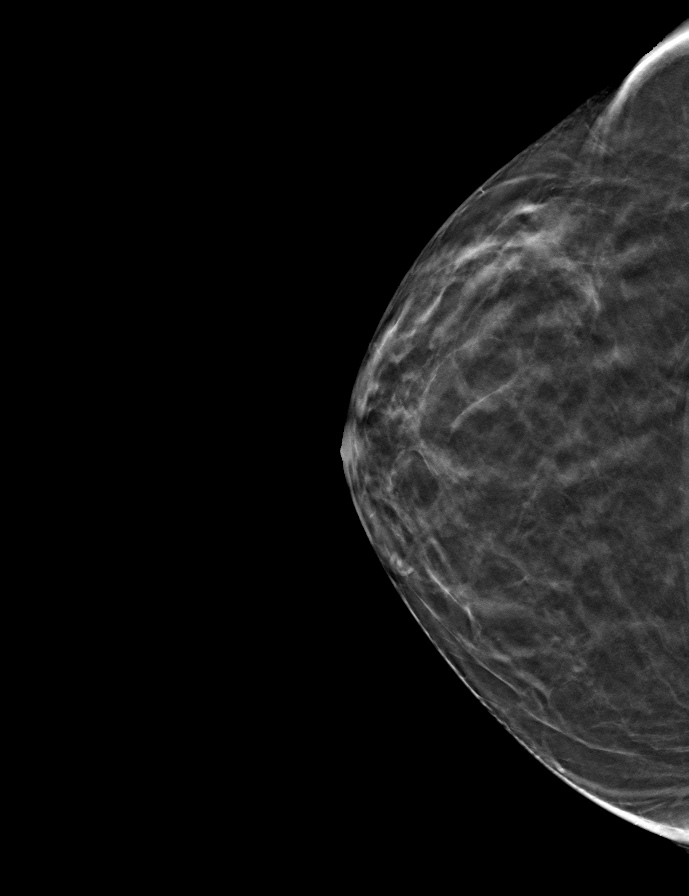

[9 of 24 positions shown; findings below may reference images not displayed]

ACR Breast Density Category b: There are scattered areas of
fibroglandular density.
FINDINGS: There are no findings suspicious for malignancy. Images were
processed with CAD.
IMPRESSION: No mammographic evidence of malignancy. A result letter of this
screening mammogram will be mailed directly to the patient.

RECOMMENDATION:
Screening mammogram in one year. (Code:[id number])

BI-RADS CATEGORY  1: Negative.

## 2021-05-29 ENCOUNTER — Other Ambulatory Visit: Payer: Self-pay | Admitting: Orthopedic Surgery

## 2021-06-19 ENCOUNTER — Encounter (HOSPITAL_COMMUNITY): Payer: 59

## 2021-06-26 ENCOUNTER — Ambulatory Visit: Admission: RE | Admit: 2021-06-26 | Payer: 59 | Source: Home / Self Care | Admitting: Orthopedic Surgery

## 2021-06-26 ENCOUNTER — Encounter: Admission: RE | Payer: Self-pay | Source: Home / Self Care

## 2021-06-26 SURGERY — ARTHROSCOPY, KNEE
Anesthesia: Choice | Site: Knee | Laterality: Left

## 2021-08-29 DIAGNOSIS — M25562 Pain in left knee: Secondary | ICD-10-CM | POA: Diagnosis not present

## 2021-08-29 DIAGNOSIS — S83242A Other tear of medial meniscus, current injury, left knee, initial encounter: Secondary | ICD-10-CM | POA: Diagnosis not present

## 2021-09-21 ENCOUNTER — Other Ambulatory Visit: Payer: Self-pay | Admitting: Orthopedic Surgery

## 2021-09-22 NOTE — Progress Notes (Signed)
Sent message, via epic in basket, requesting orders in epic from surgeon.  

## 2021-09-27 NOTE — Progress Notes (Signed)
? ? Your procedure is scheduled on:  ?  10/09/2021.   ? Report to [facility] Main  Entrance ? ? Report to admitting at       1000         AM ?DO NOT BRING INSURANCE CARD, PICTURE ID OR WALLET DAY OF SURGERY.  ?  ? ? Call this number if you have problems the morning of surgery [phone]  ? ? REMEMBER: NO  SOLID FOODS , CANDY, GUM OR MINTS AFTER MIDNITE THE NITE BEFORE SURGERY .       Marland Kitchen CLEAR LIQUIDS UNTIL      0915am           DAY OF SURGERY.      PLEASE FINISH ENSURE DRINK PER SURGEON ORDER  WHICH NEEDS TO BE COMPLETED AT  0915am        MORNING OF SURGERY.   ? ? ? ? ?CLEAR LIQUID DIET ? ? ?Foods Allowed      ?WATER ?BLACK COFFEE ( SUGAR OK, NO MILK, CREAM OR CREAMER) REGULAR AND DECAF  ?TEA ( SUGAR OK NO MILK, CREAM, OR CREAMER) REGULAR AND DECAF  ?PLAIN JELLO ( NO RED)  ?FRUIT ICES ( NO RED, NO FRUIT PULP)  ?POPSICLES ( NO RED)  ?JUICE- APPLE, WHITE GRAPE AND WHITE CRANBERRY  ?SPORT DRINK LIKE GATORADE ( NO RED)  ?CLEAR BROTH ( VEGETABLE , CHICKEN OR BEEF)                                                               ? ?    ? ?BRUSH YOUR TEETH MORNING OF SURGERY AND RINSE YOUR MOUTH OUT, NO CHEWING GUM CANDY OR MINTS. ?  ? ? Take these medicines the morning of surgery with A SIP OF WATER:   ? ? ? ?DO NOT TAKE ANY DIABETIC MEDICATIONS DAY OF YOUR SURGERY ?                  ?            You may not have any metal on your body including hair pins and  ?            piercings  Do not wear jewelry, make-up, lotions, powders or perfumes, deodorant ?            Do not wear nail polish on your fingernails.   ?           IF YOU ARE A FEMALE AND WANT TO SHAVE UNDER ARMS OR LEGS PRIOR TO SURGERY YOU MUST DO SO AT LEAST 48 HOURS PRIOR TO SURGERY.  ?            Men may shave face and neck. ? ? Do not bring valuables to the hospital. [facility] IS NOT ?            RESPONSIBLE   FOR VALUABLES. ? Contacts, dentures or bridgework may not be worn into surgery. ? Leave suitcase in the car. After surgery it may be brought  to your room. ? ?  ? Patients discharged the day of surgery will not be allowed to drive home. IF YOU ARE HAVING SURGERY AND GOING HOME THE SAME DAY, YOU MUST HAVE AN ADULT TO DRIVE YOU HOME  AND BE WITH YOU FOR 24 HOURS. YOU MAY GO HOME BY TAXI OR UBER OR ORTHERWISE, BUT AN ADULT MUST ACCOMPANY YOU HOME AND STAY WITH YOU FOR 24 HOURS. ?  ? ?            Please read over the following fact sheets you were given: ?_____________________________________________________________________ ? ? - Preparing for Surgery ?Before surgery, you can play an important role.  Because skin is not sterile, your skin needs to be as free of germs as possible.  You can reduce the number of germs on your skin by washing with CHG (chlorahexidine gluconate) soap before surgery.  CHG is an antiseptic cleaner which kills germs and bonds with the skin to continue killing germs even after washing. ?Please DO NOT use if you have an allergy to CHG or antibacterial soaps.  If your skin becomes reddened/irritated stop using the CHG and inform your nurse when you arrive at Short Stay. ?Do not shave (including legs and underarms) for at least 48 hours prior to the first CHG shower.  You may shave your face/neck. ?Please follow these instructions carefully: ? 1.  Shower with CHG Soap the night before surgery and the  morning of Surgery. ? 2.  If you choose to wash your hair, wash your hair first as usual with your  normal  shampoo. ? 3.  After you shampoo, rinse your hair and body thoroughly to remove the  shampoo.                           4.  Use CHG as you would any other liquid soap.  You can apply chg directly  to the skin and wash  ?                     Gently with a scrungie or clean washcloth. ? 5.  Apply the CHG Soap to your body ONLY FROM THE NECK DOWN.   Do not use on face/ open      ?                     Wound or open sores. Avoid contact with eyes, ears mouth and genitals (private parts).  ?                     Production manager,   Genitals (private parts) with your normal soap. ?            6.  Wash thoroughly, paying special attention to the area where your surgery  will be performed. ? 7.  Thoroughly rinse your body with warm water from the neck down. ? 8.  DO NOT shower/wash with your normal soap after using and rinsing off  the CHG Soap. ?               9.  Pat yourself dry with a clean towel. ?           10.  Wear clean pajamas. ?           11.  Place clean sheets on your bed the night of your first shower and do not  sleep with pets. ?Day of Surgery : ?Do not apply any lotions/deodorants the morning of surgery.  Please wear clean clothes to the hospital/surgery center. ? ?FAILURE TO FOLLOW THESE INSTRUCTIONS MAY RESULT IN THE CANCELLATION OF YOUR SURGERY ?PATIENT SIGNATURE_________________________________ ? ?NURSE SIGNATURE__________________________________ ? ?________________________________________________________________________  ? ? ?           ?

## 2021-09-27 NOTE — Progress Notes (Addendum)
Anesthesia Review: ? ?PCP: [facility]  ?Cardiologist : none  ?Chest x-ray : ?EKG : ?Echo : ?Stress test: ?Cardiac Cath :  ?Activity level: can do a flight of stairs without difficulty  ?Sleep Study/ CPAP : none  ?Fasting Blood Sugar :      / Checks Blood Sugar -- times a day:   ?Blood Thinner/ Instructions /Last Dose: ?ASA / Instructions/ Last Dose :  \ ?No covid test- ambulatory surgery  ?

## 2021-10-02 ENCOUNTER — Encounter (HOSPITAL_COMMUNITY): Payer: Self-pay

## 2021-10-02 ENCOUNTER — Other Ambulatory Visit: Payer: Self-pay

## 2021-10-02 ENCOUNTER — Encounter (HOSPITAL_COMMUNITY)
Admission: RE | Admit: 2021-10-02 | Discharge: 2021-10-02 | Disposition: A | Discharge status: Home / Self Care | Payer: 59 | Source: Ambulatory Visit | Attending: Orthopedic Surgery | Admitting: Orthopedic Surgery

## 2021-10-02 VITALS — BP 103/72 | HR 69 | Temp 98.7°F | Resp 16 | Ht 71.0 in | Wt 180.0 lb

## 2021-10-02 DIAGNOSIS — Z01812 Encounter for preprocedural laboratory examination: Secondary | ICD-10-CM | POA: Insufficient documentation

## 2021-10-02 DIAGNOSIS — Z01818 Encounter for other preprocedural examination: Secondary | ICD-10-CM

## 2021-10-02 HISTORY — DX: Unspecified osteoarthritis, unspecified site: M19.90

## 2021-10-02 HISTORY — DX: Other complications of anesthesia, initial encounter: T88.59XA

## 2021-10-02 LAB — CBC
HCT: 39.7 % (ref 36.0–46.0)
Hemoglobin: 13.2 g/dL (ref 12.0–15.0)
MCH: 31.6 pg (ref 26.0–34.0)
MCHC: 33.2 g/dL (ref 30.0–36.0)
MCV: 95 fL (ref 80.0–100.0)
Platelets: 240 10*3/uL (ref 150–400)
RBC: 4.18 MIL/uL (ref 3.87–5.11)
RDW: 12.5 % (ref 11.5–15.5)
WBC: 7.3 10*3/uL (ref 4.0–10.5)
nRBC: 0 % (ref 0.0–0.2)

## 2021-10-03 DIAGNOSIS — S83242A Other tear of medial meniscus, current injury, left knee, initial encounter: Secondary | ICD-10-CM | POA: Diagnosis present

## 2021-10-03 NOTE — H&P (Signed)
Julia Bean is an 56 y.o. female.   ?Chief Complaint: Left Knee Pain ? ?HPI: Julia Bean presents for follow-up of her left knee pain.  Of note, we did review her MRI scan which shows a large torn medial meniscus tear that folds into the medial gutter.  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  The knee continues to bother her, and her pain is even worse than her last visit.  She gets swelling in the knee every day that is worse with activity.  She previously completed physical therapy here for at least 4 weeks going twice weekly.  She states to do the therapy does cause her pain to be worse while doing the therapy, although did find some benefit of strengthening the quadriceps.  Her pain remains.  This is interfering with her normal work activity.  The pain is worse with walking or prolonged activity.  She is also active on her feet at work which exacerbates the pain on the medial aspect of the knee and will cause it to swell up more.  She continues to note a bulge along the medial joint line that happened since her more recent injury. ? ?Past Medical History:  ?Diagnosis Date  ? Arthritis   ? Complication of anesthesia   ? ? ?Past Surgical History:  ?Procedure Laterality Date  ? CESAREAN SECTION    ? KNEE SURGERY    ? OPEN REDUCTION INTERNAL FIXATION (ORIF) METACARPAL  07/25/2012  ? Procedure: OPEN REDUCTION INTERNAL FIXATION (ORIF) METACARPAL;  Surgeon: [name], MD;  Location: [facility] OR;  Service: Orthopedics;  Laterality: Right;  Open Reduction Internal Fixation Right Third Finger  ? OPEN REDUCTION INTERNAL FIXATION (ORIF) METACARPAL Right 09/04/2012  ? Procedure: OPEN REDUCTION INTERNAL FIXATION (ORIF) METACARPAL;  Surgeon: [name], MD;  Location: [facility] OR;  Service: Orthopedics;  Laterality: Right;  ORIF MIDDLE PHALANX FRACTURE AND DEBRIDEMENT  ? ? ?Family History  ?Problem Relation Age of Onset  ? Diabetes Mother   ? Cancer  Father   ? ?Social History:  reports that she has been smoking cigarettes. She has a 20.00 pack-year smoking history. She has never used smokeless tobacco. She reports current alcohol use. She reports that she does not use drugs. ? ?Allergies:  ?Allergies  ?Allergen Reactions  ? Clindamycin/Lincomycin   ?  heartburn  ? ? ?No medications prior to admission.  ? ? ?Results for orders placed or performed during the hospital encounter of 10/02/21 (from the past 48 hour(s))  ?CBC per protocol     Status: None  ? Collection Time: 10/02/21  2:29 PM  ?Result Value Ref Range  ? WBC 7.3 4.0 - 10.5 K/uL  ? RBC 4.18 3.87 - 5.11 MIL/uL  ? Hemoglobin 13.2 12.0 - 15.0 g/dL  ? HCT 39.7 36.0 - 46.0 %  ? MCV 95.0 80.0 - 100.0 fL  ? MCH 31.6 26.0 - 34.0 pg  ? MCHC 33.2 30.0 - 36.0 g/dL  ? RDW 12.5 11.5 - 15.5 %  ? Platelets 240 150 - 400 K/uL  ? nRBC 0.0 0.0 - 0.2 %  ?  Comment: Performed at [facility], [address], [place], [place] [address]  ? ?No results found. ? ?Review of Systems ? ?Last menstrual period 10/15/2014. ?Physical Exam  ? ?Assessment/Plan ?1.  Large medial meniscus tear of the left knee with a palpable fullness along the medial  joint line - symptoms are worse with activity, has performed at least 4 weeks of physical therapy which worsened her pain. ? ?PLAN  ?We discussed the patient's condition and shared with her that she is still having ongoing pain from her large medial meniscal tear.  At this point, she has failed physical therapy, and in fact this is actually worsened her pain.  She continues to have pain and swelling that is worse with activity and interfering with her daily activities.  Given her large medial meniscus tear on the MRI, her only true option for healing would be arthroscopy to clean up the torn meniscus.  She would likely benefit from physical therapy after cleaning up the meniscus to help maximize her strength and function.  She had previously talked with Delfin Edis  about trying to set up surgery, and they are currently working to get this approved through her insurance.  We will plan for left knee arthroscopy with likely meniscectomy, as this is likely the only intervention to help relieve her pain.  We will have her back and call the patient to help approved through her insurance.  Discussed with the patient she will likely anticipate being out of work for about 4-6 weeks following the surgery with planned PT following his surgery.  All questions answered. ? ?Dannielle Burn, PA-C ?10/03/2021, 3:11 PM ? ? ? ?

## 2021-10-09 ENCOUNTER — Ambulatory Visit (HOSPITAL_COMMUNITY)
Admission: RE | Admit: 2021-10-09 | Discharge: 2021-10-09 | Disposition: A | Discharge status: Home / Self Care | Payer: 59 | Attending: Orthopedic Surgery | Admitting: Orthopedic Surgery

## 2021-10-09 ENCOUNTER — Ambulatory Visit (HOSPITAL_BASED_OUTPATIENT_CLINIC_OR_DEPARTMENT_OTHER): Payer: 59 | Admitting: Certified Registered Nurse Anesthetist

## 2021-10-09 ENCOUNTER — Encounter (HOSPITAL_COMMUNITY): Payer: Self-pay | Admitting: Orthopedic Surgery

## 2021-10-09 ENCOUNTER — Encounter (HOSPITAL_COMMUNITY)
Admission: RE | Disposition: A | Discharge status: Home / Self Care | Payer: Self-pay | Source: Home / Self Care | Attending: Orthopedic Surgery

## 2021-10-09 ENCOUNTER — Ambulatory Visit (HOSPITAL_COMMUNITY): Payer: 59 | Admitting: Certified Registered Nurse Anesthetist

## 2021-10-09 DIAGNOSIS — S83282A Other tear of lateral meniscus, current injury, left knee, initial encounter: Secondary | ICD-10-CM | POA: Insufficient documentation

## 2021-10-09 DIAGNOSIS — M1990 Unspecified osteoarthritis, unspecified site: Secondary | ICD-10-CM | POA: Diagnosis not present

## 2021-10-09 DIAGNOSIS — X58XXXA Exposure to other specified factors, initial encounter: Secondary | ICD-10-CM | POA: Diagnosis not present

## 2021-10-09 DIAGNOSIS — R69 Illness, unspecified: Secondary | ICD-10-CM | POA: Diagnosis not present

## 2021-10-09 DIAGNOSIS — S83242A Other tear of medial meniscus, current injury, left knee, initial encounter: Secondary | ICD-10-CM | POA: Insufficient documentation

## 2021-10-09 DIAGNOSIS — F17210 Nicotine dependence, cigarettes, uncomplicated: Secondary | ICD-10-CM | POA: Diagnosis not present

## 2021-10-09 HISTORY — PX: KNEE ARTHROSCOPY: SHX127

## 2021-10-09 SURGERY — ARTHROSCOPY, KNEE
Anesthesia: General | Site: Knee | Laterality: Left

## 2021-10-09 MED ORDER — HYDROMORPHONE HCL 1 MG/ML IJ SOLN
0.2500 mg | INTRAMUSCULAR | Status: DC | PRN
  Administered 2021-10-09 (×3): 0.5 mg via INTRAVENOUS

## 2021-10-09 MED ORDER — MIDAZOLAM HCL 5 MG/5ML IJ SOLN
INTRAMUSCULAR | Status: DC | PRN
  Administered 2021-10-09: 2 mg via INTRAVENOUS

## 2021-10-09 MED ORDER — CELECOXIB 200 MG PO CAPS
200.0000 mg | ORAL_CAPSULE | Freq: Once | ORAL | Status: AC
  Administered 2021-10-09: 200 mg via ORAL
  Filled 2021-10-09: qty 1

## 2021-10-09 MED ORDER — EPINEPHRINE PF 1 MG/ML IJ SOLN
INTRAMUSCULAR | Status: DC | PRN
  Administered 2021-10-09: 2 mg

## 2021-10-09 MED ORDER — METHOCARBAMOL 500 MG IVPB - SIMPLE MED
INTRAVENOUS | Status: AC
Start: 2021-10-09 — End: 2021-10-09
  Filled 2021-10-09: qty 50

## 2021-10-09 MED ORDER — DEXAMETHASONE SODIUM PHOSPHATE 10 MG/ML IJ SOLN
INTRAMUSCULAR | Status: DC | PRN
Start: 2021-10-09 — End: 2021-10-09
  Administered 2021-10-09: 10 mg via INTRAVENOUS

## 2021-10-09 MED ORDER — FENTANYL CITRATE PF 50 MCG/ML IJ SOSY
25.0000 ug | PREFILLED_SYRINGE | INTRAMUSCULAR | Status: DC | PRN
  Administered 2021-10-09: 50 ug via INTRAVENOUS

## 2021-10-09 MED ORDER — ONDANSETRON HCL 4 MG/2ML IJ SOLN
INTRAMUSCULAR | Status: DC | PRN
  Administered 2021-10-09: 4 mg via INTRAVENOUS

## 2021-10-09 MED ORDER — GABAPENTIN 300 MG PO CAPS
300.0000 mg | ORAL_CAPSULE | Freq: Once | ORAL | Status: AC
  Administered 2021-10-09: 300 mg via ORAL
  Filled 2021-10-09: qty 1

## 2021-10-09 MED ORDER — LACTATED RINGERS IV SOLN: INTRAVENOUS | Status: DC

## 2021-10-09 MED ORDER — PROPOFOL 10 MG/ML IV BOLUS
INTRAVENOUS | Status: AC
  Filled 2021-10-09: qty 20

## 2021-10-09 MED ORDER — FENTANYL CITRATE PF 50 MCG/ML IJ SOSY
PREFILLED_SYRINGE | INTRAMUSCULAR | Status: AC
  Filled 2021-10-09: qty 2

## 2021-10-09 MED ORDER — BUPIVACAINE-EPINEPHRINE 0.5% -1:200000 IJ SOLN
INTRAMUSCULAR | Status: DC | PRN
  Administered 2021-10-09: 20 mL

## 2021-10-09 MED ORDER — ORAL CARE MOUTH RINSE: 15.0000 mL | Freq: Once | OROMUCOSAL | Status: AC

## 2021-10-09 MED ORDER — HYDROMORPHONE HCL 2 MG/ML IJ SOLN
INTRAMUSCULAR | Status: AC
  Filled 2021-10-09: qty 1

## 2021-10-09 MED ORDER — LIDOCAINE 2% (20 MG/ML) 5 ML SYRINGE
INTRAMUSCULAR | Status: DC | PRN
  Administered 2021-10-09: 60 mg via INTRAVENOUS

## 2021-10-09 MED ORDER — MIDAZOLAM HCL 2 MG/2ML IJ SOLN
INTRAMUSCULAR | Status: AC
  Filled 2021-10-09: qty 2

## 2021-10-09 MED ORDER — AMISULPRIDE (ANTIEMETIC) 5 MG/2ML IV SOLN: 10.0000 mg | Freq: Once | INTRAVENOUS | Status: DC | PRN

## 2021-10-09 MED ORDER — DEXAMETHASONE SODIUM PHOSPHATE 10 MG/ML IJ SOLN
INTRAMUSCULAR | Status: AC
  Filled 2021-10-09: qty 1

## 2021-10-09 MED ORDER — TRAMADOL HCL 50 MG PO TABS: 50.0000 mg | ORAL_TABLET | Freq: Four times a day (QID) | ORAL | 0 refills | Status: AC | PRN

## 2021-10-09 MED ORDER — HYDROMORPHONE HCL 1 MG/ML IJ SOLN
INTRAMUSCULAR | Status: DC | PRN
Start: 2021-10-09 — End: 2021-10-09
  Administered 2021-10-09 (×2): .5 mg via INTRAVENOUS
  Administered 2021-10-09: 1 mg via INTRAVENOUS

## 2021-10-09 MED ORDER — METHOCARBAMOL 500 MG IVPB - SIMPLE MED
500.0000 mg | Freq: Once | INTRAVENOUS | Status: AC
  Administered 2021-10-09: 500 mg via INTRAVENOUS

## 2021-10-09 MED ORDER — ACETAMINOPHEN 500 MG PO TABS
1000.0000 mg | ORAL_TABLET | Freq: Once | ORAL | Status: AC
  Administered 2021-10-09: 1000 mg via ORAL
  Filled 2021-10-09: qty 2

## 2021-10-09 MED ORDER — CEFAZOLIN SODIUM-DEXTROSE 2-4 GM/100ML-% IV SOLN
2.0000 g | INTRAVENOUS | Status: AC
  Administered 2021-10-09: 2 g via INTRAVENOUS
  Filled 2021-10-09: qty 100

## 2021-10-09 MED ORDER — PROPOFOL 10 MG/ML IV BOLUS
INTRAVENOUS | Status: DC | PRN
  Administered 2021-10-09: 200 mg via INTRAVENOUS

## 2021-10-09 MED ORDER — HYDROMORPHONE HCL 1 MG/ML IJ SOLN
INTRAMUSCULAR | Status: AC
  Filled 2021-10-09: qty 1

## 2021-10-09 MED ORDER — BUPIVACAINE-EPINEPHRINE 0.5% -1:200000 IJ SOLN
INTRAMUSCULAR | Status: AC
  Filled 2021-10-09: qty 1

## 2021-10-09 MED ORDER — ONDANSETRON HCL 4 MG/2ML IJ SOLN
INTRAMUSCULAR | Status: AC
  Filled 2021-10-09: qty 2

## 2021-10-09 MED ORDER — CHLORHEXIDINE GLUCONATE 0.12 % MT SOLN
15.0000 mL | Freq: Once | OROMUCOSAL | Status: AC
  Administered 2021-10-09: 15 mL via OROMUCOSAL

## 2021-10-09 SURGICAL SUPPLY — 43 items
APL PRP STRL LF DISP 70% ISPRP (MISCELLANEOUS) ×1
BAG COUNTER SPONGE SURGICOUNT (BAG) IMPLANT
BAG SPEC THK2 15X12 ZIP CLS (MISCELLANEOUS) ×1
BAG SPNG CNTER NS LX DISP (BAG)
BAG ZIPLOCK 12X15 (MISCELLANEOUS) ×3 IMPLANT
BLADE SURG SZ11 CARB STEEL (BLADE) IMPLANT
BNDG ELASTIC 6X5.8 VLCR STR LF (GAUZE/BANDAGES/DRESSINGS) ×1 IMPLANT
CHLORAPREP W/TINT 26 (MISCELLANEOUS) ×3 IMPLANT
COVER SURGICAL LIGHT HANDLE (MISCELLANEOUS) ×3 IMPLANT
CUFF TOURN SGL QUICK 34 (TOURNIQUET CUFF) ×2
CUFF TRNQT CYL 34X4.125X (TOURNIQUET CUFF) ×2 IMPLANT
DISSECTOR  3.8MM X 13CM (MISCELLANEOUS)
DISSECTOR 3.5MM X 13CM (MISCELLANEOUS) ×1 IMPLANT
DISSECTOR 3.8MM X 13CM (MISCELLANEOUS) IMPLANT
DRAPE SHEET LG 3/4 BI-LAMINATE (DRAPES) ×3 IMPLANT
DRSG PAD ABDOMINAL 8X10 ST (GAUZE/BANDAGES/DRESSINGS) ×2 IMPLANT
EXCALIBUR 3.8MM X 13CM (MISCELLANEOUS) ×1 IMPLANT
GAUZE 4X4 16PLY ~~LOC~~+RFID DBL (SPONGE) ×3 IMPLANT
GAUZE SPONGE 4X4 12PLY STRL (GAUZE/BANDAGES/DRESSINGS) ×1 IMPLANT
GAUZE XEROFORM 1X8 LF (GAUZE/BANDAGES/DRESSINGS) ×1 IMPLANT
GLOVE SURG ENC MOIS LTX SZ7.5 (GLOVE) ×3 IMPLANT
GLOVE SURG ENC MOIS LTX SZ8.5 (GLOVE) ×3 IMPLANT
GLOVE SURG UNDER LTX SZ8 (GLOVE) ×3 IMPLANT
GLOVE SURG UNDER POLY LF SZ9 (GLOVE) ×3 IMPLANT
GOWN STRL REUS W/ TWL XL LVL3 (GOWN DISPOSABLE) ×4 IMPLANT
GOWN STRL REUS W/TWL XL LVL3 (GOWN DISPOSABLE) ×4
IRRIG SUCT STRYKERFLOW 2 WTIP (MISCELLANEOUS) ×2
IRRIGATION SUCT STRKRFLW 2 WTP (MISCELLANEOUS) ×2 IMPLANT
KIT BASIN OR (CUSTOM PROCEDURE TRAY) IMPLANT
MANIFOLD NEPTUNE II (INSTRUMENTS) ×3 IMPLANT
NDL SAFETY ECLIPSE 18X1.5 (NEEDLE) ×2 IMPLANT
NDL SPNL 18GX3.5 QUINCKE PK (NEEDLE) ×2 IMPLANT
NEEDLE HYPO 18GX1.5 SHARP (NEEDLE) ×2
NEEDLE HYPO 22GX1.5 SAFETY (NEEDLE) ×3 IMPLANT
NEEDLE SPNL 18GX3.5 QUINCKE PK (NEEDLE) ×2 IMPLANT
PACK ARTHROSCOPY WL (CUSTOM PROCEDURE TRAY) ×3 IMPLANT
PROTECTOR NERVE ULNAR (MISCELLANEOUS) ×3 IMPLANT
SPONGE T-LAP 4X18 ~~LOC~~+RFID (SPONGE) ×3 IMPLANT
SYR 20ML LL LF (SYRINGE) ×3 IMPLANT
SYR BULB IRRIG 60ML STRL (SYRINGE) ×3 IMPLANT
TOWEL OR 17X26 10 PK STRL BLUE (TOWEL DISPOSABLE) ×3 IMPLANT
TUBING ARTHROSCOPY IRRIG 16FT (MISCELLANEOUS) ×3 IMPLANT
WRAP KNEE MAXI GEL POST OP (GAUZE/BANDAGES/DRESSINGS) ×3 IMPLANT

## 2021-10-09 NOTE — Op Note (Signed)
Pre-Op Dx: Left knee posterior horn medial meniscal tear ? ?Postop Dx: Left knee posterior horn medial meniscal tear parrot-beak, lateral meniscal tear radial near the root ? ?Procedure: Left knee partial arthroscopic medial and lateral meniscectomies ? ?Surgeon: Feliberto Gottron. Turner Daniels M.D.  ?Assist: Tomi Likens. Gaylene Brooks  (present throughout entire procedure and necessary for timely completion of the procedure) ?Anes: General LMA  ?EBL: Minimal  ?Fluids: 800 cc  ? ?Indications: 56 year old female with catching popping and pain in her left knee.  MRI scan shows large posterior horn medial meniscal tear as well as some mild chondromalacia of the patellofemoral joint.. Pt has failed conservative treatment with anti-inflammatory medicines, physical therapy, and modified activites but did get good temporarily from an intra-articular cortisone injection. Pain has recurred and patient desires elective arthroscopic evaluation and treatment of knee. Risks and benefits of surgery have been discussed and questions answered. ? ?Procedure: Patient identified by arm band and taken to the operating room at the day surgery Center. The appropriate anesthetic monitors were attached, and General LMA anesthesia was induced without difficulty. Lateral post was applied to the table and the lower extremity was prepped and draped in usual sterile fashion from the ankle to the midthigh. Time out procedure was performed. We began the operation by making standard inferior lateral and inferior medial peripatellar portals with a #11 blade allowing introduction of the arthroscope through the inferior lateral portal and the out flow to the inferior medial portal. 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  Moving the medial compartment we identified a large displaced parrot-beak tear of the medial  meniscus that was white on white and shredded.  It was removed with a 3.8 mm Excalibur sucker shaver and finished with a 3.5 dissector.  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  There was some osteophytes in the notch occupying about 50% of the space.  Moving of the lateral compartment the lateral meniscus was in good shape except for the posterior horn near the root where there is some complex tearing that was flipping in and out of the notch region.  This is debrided back to a stable margin with a 3.5 Arthrex dissector.  The gutters were cleared medially and laterally.  There was minimal chondromalacia to the medial and lateral condyles of the femur and the tibia also lightly debrided.. The knee was irrigated out normal saline solution. A dressing of xerofoam 4 x 4 dressing sponges, web roll and an Ace wrap was applied. The patient was awakened extubated and taken to the recovery without difficulty. ? ? ? ?Signed: [name], MD  ?

## 2021-10-09 NOTE — Anesthesia Preprocedure Evaluation (Signed)
Anesthesia Evaluation  ?Patient identified by MRN, date of birth, ID band ?Patient awake ? ? ? ?Reviewed: ?Allergy & Precautions, NPO status , Patient's Chart, lab work & pertinent test results ? ?Airway ?Mallampati: II ? ?TM Distance: >3 FB ?Neck ROM: Full ? ? ? Dental ? ?(+) Dental Advisory Given ?  ?Pulmonary ?Current Smoker,  ?  ?breath sounds clear to auscultation ? ? ? ? ? ? Cardiovascular ?negative cardio ROS ? ? ?Rhythm:Regular Rate:Normal ? ? ?  ?Neuro/Psych ?negative neurological ROS ?   ? GI/Hepatic ?negative GI ROS, Neg liver ROS,   ?Endo/Other  ?negative endocrine ROS ? Renal/GU ?negative Renal ROS  ? ?  ?Musculoskeletal ? ?(+) Arthritis ,  ? Abdominal ?  ?Peds ? Hematology ?negative hematology ROS ?(+)   ?Anesthesia Other Findings ? ? Reproductive/Obstetrics ? ?  ? ? ? ? ? ? ? ? ? ? ? ? ? ?  ?  ? ? ? ? ? ? ? ? ?Anesthesia Physical ?Anesthesia Plan ? ?ASA: 2 ? ?Anesthesia Plan: General  ? ?Post-op Pain Management: Gabapentin PO (pre-op)*, Celebrex PO (pre-op)* and Tylenol PO (pre-op)*  ? ?Induction: Intravenous ? ?PONV Risk Score and Plan: 2 and Dexamethasone, Ondansetron and Treatment may vary due to age or medical condition ? ?Airway Management Planned: LMA ? ?Additional Equipment: None ? ?Intra-op Plan:  ? ?Post-operative Plan: Extubation in OR ? ?Informed Consent: I have reviewed the patients History and Physical, chart, labs and discussed the procedure including the risks, benefits and alternatives for the proposed anesthesia with the patient or authorized representative who has indicated his/her understanding and acceptance.  ? ? ? ?Dental advisory given ? ?Plan Discussed with: CRNA ? ?Anesthesia Plan Comments:   ? ? ? ? ? ? ?Anesthesia Quick Evaluation ? ?

## 2021-10-09 NOTE — Anesthesia Procedure Notes (Signed)
Procedure Name: LMA Insertion ?Date/Time: 10/09/2021 12:11 PM ?Performed by: Sudie Grumbling, CRNA ?Pre-anesthesia Checklist: Patient identified, Emergency Drugs available, Suction available and Patient being monitored ?Patient Re-evaluated:Patient Re-evaluated prior to induction ?Oxygen Delivery Method: Circle system utilized ?Preoxygenation: Pre-oxygenation with 100% oxygen ?Induction Type: IV induction ?LMA: LMA inserted ?LMA Size: 4.0 ?Number of attempts: 1 ?Placement Confirmation: positive ETCO2 and breath sounds checked- equal and bilateral ?Tube secured with: Tape ?Dental Injury: Teeth and Oropharynx as per pre-operative assessment  ? ? ? ? ?

## 2021-10-09 NOTE — Transfer of Care (Signed)
Immediate Anesthesia Transfer of Care Note ? ?Patient: Julia Bean ? ?Procedure(s) Performed: LEFT KNEE ARTHROSCOPY WITH PARTIAL AND LATERAL MENISECTOMY (Left: Knee) ? ?Patient Location: PACU ? ?Anesthesia Type:General ? ?Level of Consciousness: drowsy ? ?Airway & Oxygen Therapy: Patient Spontanous Breathing and Patient connected to face mask ? ?Post-op Assessment: Report given to RN and Post -op Vital signs reviewed and stable ? ?Post vital signs: Reviewed and stable ? ?Last Vitals:  ?Vitals Value Taken Time  ?BP 131/79 10/09/21 1300  ?Temp    ?Pulse 88 10/09/21 1301  ?Resp 11 10/09/21 1301  ?SpO2 100 % 10/09/21 1301  ?Vitals shown include unvalidated device data. ? ?Last Pain:  ?Vitals:  ? 10/09/21 1005  ?TempSrc:   ?PainSc: 0-No pain  ?   ? ?  ? ?Complications: No notable events documented. ?

## 2021-10-09 NOTE — Interval H&P Note (Signed)
History and Physical Interval Note: ? ?10/09/2021 ?10:01 AM ? ?Julia Bean  has presented today for surgery, with the diagnosis of LEFT KNEE MEDIAL MENISCUS TEAR.  The various methods of treatment have been discussed with the patient and family. After consideration of risks, benefits and other options for treatment, the patient has consented to  Procedure(s): ?LEFT KNEE ARTHROSCOPY (Left) as a surgical intervention.  The patient's history has been reviewed, patient examined, no change in status, stable for surgery.  I have reviewed the patient's chart and labs.  Questions were answered to the patient's satisfaction.   ? ? ?[name] ? ? ?

## 2021-10-10 NOTE — Progress Notes (Signed)
Wasted 0.5mg  Dilaudid in stericycle; witnessed by Leslie Dales, RN (Pt had been discharged from system before waste was completed).  ?

## 2021-10-10 NOTE — Anesthesia Postprocedure Evaluation (Signed)
Anesthesia Post Note ? ?Patient: Julia Bean ? ?Procedure(s) Performed: LEFT KNEE ARTHROSCOPY WITH PARTIAL AND LATERAL MENISECTOMY (Left: Knee) ? ?  ? ?Patient location during evaluation: PACU ?Anesthesia Type: General ?Level of consciousness: awake and alert ?Pain management: pain level controlled ?Vital Signs Assessment: post-procedure vital signs reviewed and stable ?Respiratory status: spontaneous breathing, nonlabored ventilation, respiratory function stable and patient connected to nasal cannula oxygen ?Cardiovascular status: blood pressure returned to baseline and stable ?Postop Assessment: no apparent nausea or vomiting ?Anesthetic complications: no ? ? ?No notable events documented. ? ?Last Vitals:  ?Vitals:  ? 10/09/21 1415 10/09/21 1430  ?BP: 119/69 113/69  ?Pulse: (!) 59 63  ?Resp: 20 18  ?Temp:    ?SpO2: 94% 92%  ?  ?Last Pain:  ?Vitals:  ? 10/09/21 1430  ?TempSrc:   ?PainSc: 0-No pain  ? ? ?  ?  ?  ?  ?  ?  ? ?Trevor Iha ? ? ? ? ?

## 2021-10-11 ENCOUNTER — Encounter (HOSPITAL_COMMUNITY): Payer: Self-pay | Admitting: Orthopedic Surgery

## 2022-02-27 DIAGNOSIS — M25562 Pain in left knee: Secondary | ICD-10-CM | POA: Diagnosis not present

## 2022-06-19 DIAGNOSIS — M1712 Unilateral primary osteoarthritis, left knee: Secondary | ICD-10-CM | POA: Diagnosis not present

## 2022-07-26 ENCOUNTER — Ambulatory Visit
Admission: EM | Admit: 2022-07-26 | Discharge: 2022-07-26 | Disposition: A | Discharge status: Home / Self Care | Payer: 59 | Attending: Nurse Practitioner | Admitting: Nurse Practitioner

## 2022-07-26 ENCOUNTER — Encounter: Payer: Self-pay | Admitting: Emergency Medicine

## 2022-07-26 DIAGNOSIS — J209 Acute bronchitis, unspecified: Secondary | ICD-10-CM

## 2022-07-26 MED ORDER — ALBUTEROL SULFATE HFA 108 (90 BASE) MCG/ACT IN AERS: 2.0000 | INHALATION_SPRAY | Freq: Four times a day (QID) | RESPIRATORY_TRACT | 0 refills | Status: DC | PRN

## 2022-07-26 MED ORDER — PREDNISONE 20 MG PO TABS: 40.0000 mg | ORAL_TABLET | Freq: Every day | ORAL | 0 refills | Status: AC

## 2022-07-26 NOTE — ED Provider Notes (Signed)
[facility]    CSN: [id number] Arrival date & time: 07/26/22  1629      History   Chief Complaint No chief complaint on file.   HPI Julia Bean is a 56 y.o. female.   The history is provided by the patient.   Patient presents for complaints of cough that been present over the last several days.  Patient states she initially had fever when her symptoms started, but that has since resolved.  She continues to report that she has chest congestion.  He does endorse wheezing.  She denies fatigue, throat, headache, shortness of breath, difficulty breathing, abdominal pain, nausea, vomiting, or diarrhea.  Patient reports she has been taking Mucinex with some relief.  She states that she is a smoker.  Reports she has not smoked as much since her symptoms started.  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    Past Surgical History:  Procedure Laterality Date   CESAREAN SECTION     KNEE ARTHROSCOPY Left 10/09/2021   Procedure: LEFT KNEE ARTHROSCOPY WITH PARTIAL AND LATERAL MENISECTOMY;  Surgeon: Gean Birchwood, MD;  Location: [facility];  Service: Orthopedics;  Laterality: Left;   KNEE SURGERY     OPEN REDUCTION INTERNAL FIXATION (ORIF) METACARPAL  07/25/2012   Procedure: OPEN REDUCTION INTERNAL FIXATION (ORIF) METACARPAL;  Surgeon: [name], MD;  Location: [facility];  Service: Orthopedics;  Laterality: Right;  Open Reduction Internal Fixation Right Third Finger   OPEN REDUCTION INTERNAL FIXATION (ORIF) METACARPAL Right 09/04/2012   Procedure: OPEN REDUCTION INTERNAL FIXATION (ORIF) METACARPAL;  Surgeon: [name], MD;  Location: [facility];  Service: Orthopedics;  Laterality: Right;  ORIF MIDDLE PHALANX FRACTURE AND DEBRIDEMENT    OB History   No obstetric history on  file.      Home Medications    Prior to Admission medications   Medication Sig Start Date End Date Taking? Authorizing Provider  albuterol (VENTOLIN HFA) 108 (90 Base) MCG/ACT inhaler Inhale 2 puffs into the lungs every 6 (six) hours as needed for wheezing or shortness of breath. 07/26/22  Yes Sheri Gatchel-Warren, Sadie Haber, NP  predniSONE (DELTASONE) 20 MG tablet Take 2 tablets (40 mg total) by mouth daily with breakfast for 5 days. 07/26/22 07/31/22 Yes Kier Smead-Warren, Sadie Haber, NP  ibuprofen (ADVIL) 200 MG tablet Take 600-800 mg by mouth at bedtime as needed for moderate pain.    [provider]  traMADol (ULTRAM) 50 MG tablet Take 1 tablet (50 mg total) by mouth every 6 (six) hours as needed. 10/09/21 10/09/22  Allena Katz, PA-C    Family History Family History  Problem Relation Age of Onset   Diabetes Mother    Cancer Father     Social History Social History   Tobacco Use   Smoking status: Every Day    Packs/day: 1.00    Years: 20.00    Total pack years: 20.00    Types: Cigarettes   Smokeless tobacco: Never  Vaping Use   Vaping Use: Never used  Substance Use Topics   Alcohol use: Yes    Comment: rare   Drug use: No     Allergies   Clindamycin/lincomycin   Review of Systems Review of Systems Per HPI  Physical Exam Triage  Vital Signs ED Triage Vitals  Enc Vitals Group     BP 07/26/22 1647 112/70     Pulse Rate 07/26/22 1647 73     Resp 07/26/22 1647 18     Temp 07/26/22 1647 98 F (36.7 C)     Temp Source 07/26/22 1647 Oral     SpO2 07/26/22 1647 93 %     Weight --      Height --      Head Circumference --      Peak Flow --      Pain Score 07/26/22 1648 0     Pain Loc --      Pain Edu? --      Excl. in GC? --    No data found.  Updated Vital Signs BP 112/70 (BP Location: Right Arm)   Pulse 73   Temp 98 F (36.7 C) (Oral)   Resp 18   LMP 10/15/2014   SpO2 93%   Visual Acuity Right Eye Distance:   Left Eye Distance:   Bilateral  Distance:    Right Eye Near:   Left Eye Near:    Bilateral Near:     Physical Exam Vitals and nursing note reviewed.  Constitutional:      General: She is not in acute distress.    Appearance: Normal appearance.  HENT:     Head: Normocephalic.     Right Ear: Tympanic membrane, ear canal and external ear normal.     Left Ear: Tympanic membrane, ear canal and external ear normal.     Nose: Nose normal.     Mouth/Throat:     Mouth: Mucous membranes are moist.     Pharynx: Uvula midline. Posterior oropharyngeal erythema present. No pharyngeal swelling or uvula swelling.  Eyes:     Extraocular Movements: Extraocular movements intact.     Conjunctiva/sclera: Conjunctivae normal.     Pupils: Pupils are equal, round, and reactive to light.  Cardiovascular:     Rate and Rhythm: Normal rate and regular rhythm.     Pulses: Normal pulses.     Heart sounds: Normal heart sounds.  Pulmonary:     Effort: Pulmonary effort is normal. No respiratory distress.     Breath sounds: No stridor. Wheezing (RLL and LLL expiratory wheezes) present. No rhonchi or rales.  Abdominal:     General: Bowel sounds are normal.     Palpations: Abdomen is soft.     Tenderness: There is no abdominal tenderness.  Musculoskeletal:     Cervical back: Normal range of motion.  Lymphadenopathy:     Cervical: No cervical adenopathy.  Skin:    General: Skin is warm and dry.  Neurological:     General: No focal deficit present.     Mental Status: She is alert and oriented to person, place, and time.  Psychiatric:        Mood and Affect: Mood normal.        Behavior: Behavior normal.      UC Treatments / Results  Labs (all labs ordered are listed, but only abnormal results are displayed) Labs Reviewed - No data to display  EKG   Radiology No results found.  Procedures Procedures (including critical care time)  Medications Ordered in UC Medications - No data to display  Initial Impression /  Assessment and Plan / UC Course  I have reviewed the triage vital signs and the nursing notes.  Pertinent labs & imaging results that were available during my  care of the patient were reviewed by me and considered in my medical decision making (see chart for details).  Patient is well-appearing, she is in no acute distress, vital signs are stable.  Symptoms consistent with acute bronchitis given the patient's current symptoms, which may be exacerbated by her underlying smoking history.  Will start patient on prednisone 40 mg for 5 days for wheezing and cough, and an albuterol inhaler as needed for wheezing.  Supportive care recommendations were provided to the patient to include consideration for smoking cessation.  Patient was advised to continue use of Mucinex as needed for cough.  Patient verbalizes understanding.  All questions were answered.  Patient is stable for discharge.   Final Clinical Impressions(s) / UC Diagnoses   Final diagnoses:  Acute bronchitis, unspecified organism     Discharge Instructions      Take medication as prescribed. Increase fluids and allow for plenty of rest. Recommend using a humidifier at bedtime during sleep and sleeping elevated on pillows while cough symptoms persist. May use over-the-counter cough drops or throat lozenges to help with coughing. Try to cut back on smoking or consider smoking cessation while symptoms persist. If symptoms do not improve with this course of treatment, please follow-up in this clinic or with your primary care physician for further evaluation. Follow-up as needed.     ED Prescriptions     Medication Sig Dispense Auth. Provider   predniSONE (DELTASONE) 20 MG tablet Take 2 tablets (40 mg total) by mouth daily with breakfast for 5 days. 10 tablet Sandrina Heaton-Warren, Sadie Haber, NP   albuterol (VENTOLIN HFA) 108 (90 Base) MCG/ACT inhaler Inhale 2 puffs into the lungs every 6 (six) hours as needed for wheezing or shortness of  breath. 8 g Tery Hoeger-Warren, Sadie Haber, NP      PDMP not reviewed this encounter.   Abran Cantor, NP 07/26/22 1726

## 2022-07-26 NOTE — ED Triage Notes (Signed)
Cough since Friday, fever over the weekend Chest congestion.  Has been taking mucinex without relief.

## 2022-07-26 NOTE — Discharge Instructions (Signed)
Take medication as prescribed. Increase fluids and allow for plenty of rest. Recommend using a humidifier at bedtime during sleep and sleeping elevated on pillows while cough symptoms persist. May use over-the-counter cough drops or throat lozenges to help with coughing. Try to cut back on smoking or consider smoking cessation while symptoms persist. If symptoms do not improve with this course of treatment, please follow-up in this clinic or with your primary care physician for further evaluation. Follow-up as needed.

## 2022-11-06 DIAGNOSIS — M1712 Unilateral primary osteoarthritis, left knee: Secondary | ICD-10-CM | POA: Diagnosis not present

## 2022-11-13 ENCOUNTER — Encounter: Payer: Self-pay | Admitting: Emergency Medicine

## 2022-11-13 ENCOUNTER — Other Ambulatory Visit: Payer: Self-pay

## 2022-11-13 ENCOUNTER — Ambulatory Visit
Admission: EM | Admit: 2022-11-13 | Discharge: 2022-11-13 | Disposition: A | Discharge status: Home / Self Care | Payer: 59 | Attending: Nurse Practitioner | Admitting: Nurse Practitioner

## 2022-11-13 DIAGNOSIS — R197 Diarrhea, unspecified: Secondary | ICD-10-CM | POA: Diagnosis not present

## 2022-11-13 DIAGNOSIS — R110 Nausea: Secondary | ICD-10-CM | POA: Diagnosis not present

## 2022-11-13 MED ORDER — ONDANSETRON 4 MG PO TBDP: 4.0000 mg | ORAL_TABLET | Freq: Three times a day (TID) | ORAL | 0 refills | Status: DC | PRN

## 2022-11-13 NOTE — ED Triage Notes (Signed)
Pt reports sudden onset diarrhea yesterday. Pt reports missed work yesterday and reports continued fatigue,intermittent nausea. Reports grandchild and child had "stomach thing" over the weekend.

## 2022-11-13 NOTE — Discharge Instructions (Addendum)
It sounds like you have a stomach bug  Take the Zofran 4 mg ODT every 8 hours as needed for nausea/vomiting  Make sure you are pushing hydration with plenty of fluids - water or Pedialyte are best  Seek care if you develop severe abdominal pain or nausea/vomiting and are unable to drink fluids

## 2022-11-13 NOTE — ED Provider Notes (Signed)
[facility]    CSN: [id number] Arrival date & time: 11/13/22  0949      History   Chief Complaint Chief Complaint  Patient presents with   Diarrhea    HPI Julia Bean is a 57 y.o. female.   Patient presents today with 1 day history of diarrhea, hot and cold chills, decreased appetite, fatigue, weakness, and nausea without vomiting.  She denies blood in her stool, vomiting, abdominal pain, loss of taste or smell, recent foreign travel, camping, or suspicious water intake, and recent antibiotic use.  Reports yesterday, she had 4 episodes of watery stool.  She has had 2 episodes so far today.  Reports her daughter and grandson were sick with similar symptoms over the weekend.  Reports overall, she is feeling somewhat better from yesterday.    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    Past Surgical History:  Procedure Laterality Date   CESAREAN SECTION     KNEE ARTHROSCOPY Left 10/09/2021   Procedure: LEFT KNEE ARTHROSCOPY WITH PARTIAL AND LATERAL MENISECTOMY;  Surgeon: Gean Birchwood, MD;  Location: [facility];  Service: Orthopedics;  Laterality: Left;   KNEE SURGERY     OPEN REDUCTION INTERNAL FIXATION (ORIF) METACARPAL  07/25/2012   Procedure: OPEN REDUCTION INTERNAL FIXATION (ORIF) METACARPAL;  Surgeon: [name], MD;  Location: [facility];  Service: Orthopedics;  Laterality: Right;  Open Reduction Internal Fixation Right Third Finger   OPEN REDUCTION INTERNAL FIXATION (ORIF) METACARPAL Right 09/04/2012   Procedure: OPEN REDUCTION INTERNAL FIXATION (ORIF) METACARPAL;  Surgeon: [name], MD;  Location: [facility];  Service: Orthopedics;  Laterality: Right;  ORIF MIDDLE PHALANX FRACTURE AND DEBRIDEMENT    OB History   No obstetric history on file.      Home  Medications    Prior to Admission medications   Medication Sig Start Date End Date Taking? Authorizing Provider  ibuprofen (ADVIL) 200 MG tablet Take 600-800 mg by mouth at bedtime as needed for moderate pain.   Yes [provider]  ondansetron (ZOFRAN-ODT) 4 MG disintegrating tablet Take 1 tablet (4 mg total) by mouth every 8 (eight) hours as needed for nausea or vomiting. 11/13/22  Yes Valentino Nose, NP  albuterol (VENTOLIN HFA) 108 (90 Base) MCG/ACT inhaler Inhale 2 puffs into the lungs every 6 (six) hours as needed for wheezing or shortness of breath. 07/26/22   Leath-Warren, Sadie Haber, NP    Family History Family History  Problem Relation Age of Onset   Diabetes Mother    Cancer Father     Social History Social History   Tobacco Use   Smoking status: Every Day    Packs/day: 1.00    Years: 20.00    Additional pack years: 0.00    Total pack years: 20.00    Types: Cigarettes   Smokeless tobacco: Never  Vaping Use   Vaping Use: Never used  Substance Use Topics   Alcohol use: Yes    Comment: rare   Drug use: No     Allergies   Clindamycin/lincomycin   Review of Systems Review of Systems Per HPI  Physical Exam Triage Vital Signs ED Triage Vitals  Enc Vitals Group     BP 11/13/22 1038 104/72  Pulse Rate 11/13/22 1038 82     Resp 11/13/22 1038 20     Temp 11/13/22 1038 98.4 F (36.9 C)     Temp Source 11/13/22 1038 Oral     SpO2 11/13/22 1038 96 %     Weight --      Height --      Head Circumference --      Peak Flow --      Pain Score 11/13/22 1037 0     Pain Loc --      Pain Edu? --      Excl. in GC? --    No data found.  Updated Vital Signs BP 104/72 (BP Location: Right Arm)   Pulse 82   Temp 98.4 F (36.9 C) (Oral)   Resp 20   LMP 10/15/2014   SpO2 96%   Visual Acuity Right Eye Distance:   Left Eye Distance:   Bilateral Distance:    Right Eye Near:   Left Eye Near:    Bilateral Near:     Physical Exam Vitals and  nursing note reviewed.  Constitutional:      General: She is not in acute distress.    Appearance: Normal appearance. She is not toxic-appearing.  HENT:     Head: Normocephalic and atraumatic.     Mouth/Throat:     Mouth: Mucous membranes are moist.     Pharynx: Oropharynx is clear.  Eyes:     General: No scleral icterus.    Extraocular Movements: Extraocular movements intact.  Cardiovascular:     Rate and Rhythm: Normal rate and regular rhythm.  Pulmonary:     Effort: Pulmonary effort is normal. No respiratory distress.     Breath sounds: Normal breath sounds. No wheezing, rhonchi or rales.  Abdominal:     General: Abdomen is flat. Bowel sounds are normal. There is no distension.     Palpations: Abdomen is soft.     Tenderness: There is no abdominal tenderness. There is no guarding.  Musculoskeletal:     Cervical back: Normal range of motion.  Lymphadenopathy:     Cervical: No cervical adenopathy.  Skin:    General: Skin is warm and dry.     Capillary Refill: Capillary refill takes less than 2 seconds.     Coloration: Skin is not jaundiced or pale.     Findings: No erythema.  Neurological:     Mental Status: She is alert and oriented to person, place, and time.  Psychiatric:        Behavior: Behavior is cooperative.      UC Treatments / Results  Labs (all labs ordered are listed, but only abnormal results are displayed) Labs Reviewed - No data to display  EKG   Radiology No results found.  Procedures Procedures (including critical care time)  Medications Ordered in UC Medications - No data to display  Initial Impression / Assessment and Plan / UC Course  I have reviewed the triage vital signs and the nursing notes.  Pertinent labs & imaging results that were available during my care of the patient were reviewed by me and considered in my medical decision making (see chart for details).   Patient is well-appearing, normotensive, afebrile, not tachycardic,  not tachypneic, oxygenating well on room air.    1. Nausea without vomiting 2. Diarrhea, unspecified type Suspect viral gastroenteritis Vital signs and examination are reassuring I measured heart rate while patient was standing for more than 1 minute; heart rate less  than 100 bpm the entire time Will treat with Zofran 4 mg every 8 hours as needed for nausea/vomiting Recommended increasing hydration with water or Pedialyte Strict ER and return precautions discussed with patient Note given for work  The patient was given the opportunity to ask questions.  All questions answered to their satisfaction.  The patient is in agreement to this plan.    Final Clinical Impressions(s) / UC Diagnoses   Final diagnoses:  Nausea without vomiting  Diarrhea, unspecified type     Discharge Instructions      It sounds like you have a stomach bug  Take the Zofran 4 mg ODT every 8 hours as needed for nausea/vomiting  Make sure you are pushing hydration with plenty of fluids - water or Pedialyte are best  Seek care if you develop severe abdominal pain or nausea/vomiting and are unable to drink fluids     ED Prescriptions     Medication Sig Dispense Auth. Provider   ondansetron (ZOFRAN-ODT) 4 MG disintegrating tablet Take 1 tablet (4 mg total) by mouth every 8 (eight) hours as needed for nausea or vomiting. 20 tablet Valentino Nose, NP      PDMP not reviewed this encounter.   Valentino Nose, NP 11/13/22 [phone]

## 2023-02-08 DIAGNOSIS — M1712 Unilateral primary osteoarthritis, left knee: Secondary | ICD-10-CM | POA: Diagnosis not present

## 2023-03-14 DIAGNOSIS — M1712 Unilateral primary osteoarthritis, left knee: Secondary | ICD-10-CM | POA: Diagnosis not present

## 2023-05-09 DIAGNOSIS — M7052 Other bursitis of knee, left knee: Secondary | ICD-10-CM | POA: Diagnosis not present

## 2023-07-11 DIAGNOSIS — H269 Unspecified cataract: Secondary | ICD-10-CM | POA: Diagnosis not present

## 2023-07-11 DIAGNOSIS — Z008 Encounter for other general examination: Secondary | ICD-10-CM | POA: Diagnosis not present

## 2023-07-11 DIAGNOSIS — M1990 Unspecified osteoarthritis, unspecified site: Secondary | ICD-10-CM | POA: Diagnosis not present

## 2023-07-11 DIAGNOSIS — K5900 Constipation, unspecified: Secondary | ICD-10-CM | POA: Diagnosis not present

## 2023-07-11 DIAGNOSIS — Z809 Family history of malignant neoplasm, unspecified: Secondary | ICD-10-CM | POA: Diagnosis not present

## 2023-07-11 DIAGNOSIS — F17210 Nicotine dependence, cigarettes, uncomplicated: Secondary | ICD-10-CM | POA: Diagnosis not present

## 2023-07-11 DIAGNOSIS — Z8249 Family history of ischemic heart disease and other diseases of the circulatory system: Secondary | ICD-10-CM | POA: Diagnosis not present

## 2023-07-11 DIAGNOSIS — Z91199 Patient's noncompliance with other medical treatment and regimen due to unspecified reason: Secondary | ICD-10-CM | POA: Diagnosis not present

## 2023-07-11 DIAGNOSIS — Z833 Family history of diabetes mellitus: Secondary | ICD-10-CM | POA: Diagnosis not present

## 2023-07-11 DIAGNOSIS — Z823 Family history of stroke: Secondary | ICD-10-CM | POA: Diagnosis not present

## 2023-07-11 DIAGNOSIS — J449 Chronic obstructive pulmonary disease, unspecified: Secondary | ICD-10-CM | POA: Diagnosis not present

## 2023-08-09 ENCOUNTER — Encounter: Payer: Self-pay | Admitting: Emergency Medicine

## 2023-08-09 ENCOUNTER — Ambulatory Visit
Admission: EM | Admit: 2023-08-09 | Discharge: 2023-08-09 | Disposition: A | Discharge status: Home / Self Care | Payer: 59 | Attending: Family Medicine | Admitting: Family Medicine

## 2023-08-09 DIAGNOSIS — J22 Unspecified acute lower respiratory infection: Secondary | ICD-10-CM

## 2023-08-09 DIAGNOSIS — R509 Fever, unspecified: Secondary | ICD-10-CM

## 2023-08-09 DIAGNOSIS — R062 Wheezing: Secondary | ICD-10-CM

## 2023-08-09 MED ORDER — PREDNISONE 20 MG PO TABS: 40.0000 mg | ORAL_TABLET | Freq: Every day | ORAL | 0 refills | Status: DC

## 2023-08-09 MED ORDER — AZITHROMYCIN 250 MG PO TABS: ORAL_TABLET | ORAL | 0 refills | Status: DC

## 2023-08-09 MED ORDER — ALBUTEROL SULFATE HFA 108 (90 BASE) MCG/ACT IN AERS: 2.0000 | INHALATION_SPRAY | RESPIRATORY_TRACT | 0 refills | Status: AC | PRN

## 2023-08-09 MED ORDER — PROMETHAZINE-DM 6.25-15 MG/5ML PO SYRP
5.0000 mL | ORAL_SOLUTION | Freq: Four times a day (QID) | ORAL | 0 refills | Status: DC | PRN
Start: 2023-08-09 — End: 2024-06-03

## 2023-08-09 NOTE — ED Triage Notes (Signed)
 Body aches on Saturday with runny nose and cough.  Fever on Sunday.  has been taking mucinex with some relief.  Cough is productive at times.

## 2023-08-09 NOTE — ED Provider Notes (Signed)
 [facility]    CSN: [id number] Arrival date & time: 08/09/23  9175      History   Chief Complaint No chief complaint on file.   HPI Julia Bean is a 58 y.o. female.   Presenting today with 1 week history of progressively worsening body aches, cough, nasal congestion, intermittent fevers, wheezing, shortness of breath.  Denies chest pain, abdominal pain, nausea vomiting or diarrhea.  Trying Mucinex with mild temporary benefit.  No known history of chronic pulmonary disease however is a longtime smoker.    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    Past Surgical History:  Procedure Laterality Date   CESAREAN SECTION     KNEE ARTHROSCOPY Left 10/09/2021   Procedure: LEFT KNEE ARTHROSCOPY WITH PARTIAL AND LATERAL MENISECTOMY;  Surgeon: Liam Lerner, MD;  Location: [facility];  Service: Orthopedics;  Laterality: Left;   KNEE SURGERY     OPEN REDUCTION INTERNAL FIXATION (ORIF) METACARPAL  07/25/2012   Procedure: OPEN REDUCTION INTERNAL FIXATION (ORIF) METACARPAL;  Surgeon: Prentice LELON Pagan, MD;  Location: [facility];  Service: Orthopedics;  Laterality: Right;  Open Reduction Internal Fixation Right Third Finger   OPEN REDUCTION INTERNAL FIXATION (ORIF) METACARPAL Right 09/04/2012   Procedure: OPEN REDUCTION INTERNAL FIXATION (ORIF) METACARPAL;  Surgeon: Prentice LELON Pagan, MD;  Location: [facility];  Service: Orthopedics;  Laterality: Right;  ORIF MIDDLE PHALANX FRACTURE AND DEBRIDEMENT    OB History   No obstetric history on file.      Home Medications    Prior to Admission medications   Medication Sig Start Date End Date Taking? Authorizing Provider  albuterol  (VENTOLIN  HFA) 108 (90 Base) MCG/ACT inhaler Inhale 2 puffs into the lungs every 4 (four) hours as needed.  08/09/23  Yes Stuart Vernell Norris, PA-C  azithromycin  (ZITHROMAX ) 250 MG tablet Take first 2 tablets together, then 1 every day until finished. 08/09/23  Yes Stuart Vernell Norris, PA-C  predniSONE  (DELTASONE ) 20 MG tablet Take 2 tablets (40 mg total) by mouth daily with breakfast. 08/09/23  Yes Stuart Vernell Norris, PA-C  promethazine -dextromethorphan (PROMETHAZINE -DM) 6.25-15 MG/5ML syrup Take 5 mLs by mouth 4 (four) times daily as needed. 08/09/23  Yes Stuart Vernell Norris, PA-C  ibuprofen  (ADVIL ) 200 MG tablet Take 600-800 mg by mouth at bedtime as needed for moderate pain.    [provider]    Family History Family History  Problem Relation Age of Onset   Diabetes Mother    Cancer Father     Social History Social History   Tobacco Use   Smoking status: Every Day    Current packs/day: 1.00    Average packs/day: 1 pack/day for 20.0 years (20.0 ttl pk-yrs)    Types: Cigarettes   Smokeless tobacco: Never  Vaping Use   Vaping status: Never Used  Substance Use Topics   Alcohol use: Yes    Comment: rare   Drug use: No     Allergies   Clindamycin/lincomycin   Review of Systems Review of Systems Per HPI  Physical Exam Triage Vital Signs ED Triage Vitals  Encounter Vitals Group     BP 08/09/23 0829 116/83     Systolic BP Percentile --      Diastolic BP Percentile --  Pulse Rate 08/09/23 0829 91     Resp 08/09/23 0829 18     Temp 08/09/23 0829 98 F (36.7 C)     Temp Source 08/09/23 0829 Oral     SpO2 08/09/23 0829 95 %     Weight --      Height --      Head Circumference --      Peak Flow --      Pain Score 08/09/23 0831 0     Pain Loc --      Pain Education --      Exclude from Growth Chart --    No data found.  Updated Vital Signs BP 116/83 (BP Location: Right Arm)   Pulse 91   Temp 98 F (36.7 C) (Oral)   Resp 18   LMP 10/15/2014   SpO2 95%   Visual Acuity Right Eye Distance:   Left Eye Distance:   Bilateral Distance:     Right Eye Near:   Left Eye Near:    Bilateral Near:     Physical Exam Vitals and nursing note reviewed.  Constitutional:      Appearance: Normal appearance.  HENT:     Head: Atraumatic.     Right Ear: Tympanic membrane and external ear normal.     Left Ear: Tympanic membrane and external ear normal.     Nose: Congestion present.     Mouth/Throat:     Mouth: Mucous membranes are moist.     Pharynx: Posterior oropharyngeal erythema present.  Eyes:     Extraocular Movements: Extraocular movements intact.     Conjunctiva/sclera: Conjunctivae normal.  Cardiovascular:     Rate and Rhythm: Normal rate and regular rhythm.     Heart sounds: Normal heart sounds.  Pulmonary:     Effort: Pulmonary effort is normal.     Breath sounds: Wheezing and rales present.  Musculoskeletal:        General: Normal range of motion.     Cervical back: Normal range of motion and neck supple.  Skin:    General: Skin is warm and dry.  Neurological:     Mental Status: She is alert and oriented to person, place, and time.  Psychiatric:        Mood and Affect: Mood normal.        Thought Content: Thought content normal.      UC Treatments / Results  Labs (all labs ordered are listed, but only abnormal results are displayed) Labs Reviewed - No data to display  EKG   Radiology No results found.  Procedures Procedures (including critical care time)  Medications Ordered in UC Medications - No data to display  Initial Impression / Assessment and Plan / UC Course  I have reviewed the triage vital signs and the nursing notes.  Pertinent labs & imaging results that were available during my care of the patient were reviewed by me and considered in my medical decision making (see chart for details).     Given duration worsening course will treat with Zithromax , prednisone , albuterol , Phenergan  DM.  Do suspect some underlying COPD though this has not been evaluated for or diagnosed.   Over-the-counter medications and home care reviewed.  Turn for worsening symptoms.  Final Clinical Impressions(s) / UC Diagnoses   Final diagnoses:  Lower respiratory infection  Wheezing  Fever, unspecified   Discharge Instructions   None    ED Prescriptions     Medication Sig Dispense Auth. Provider  azithromycin  (ZITHROMAX ) 250 MG tablet Take first 2 tablets together, then 1 every day until finished. 6 tablet Stuart Vernell Norris, PA-C   predniSONE  (DELTASONE ) 20 MG tablet Take 2 tablets (40 mg total) by mouth daily with breakfast. 10 tablet Stuart Vernell Norris, PA-C   albuterol  (VENTOLIN  HFA) 108 (90 Base) MCG/ACT inhaler Inhale 2 puffs into the lungs every 4 (four) hours as needed. 18 g Stuart Vernell Norris, PA-C   promethazine -dextromethorphan (PROMETHAZINE -DM) 6.25-15 MG/5ML syrup Take 5 mLs by mouth 4 (four) times daily as needed. 100 mL Stuart Vernell Norris, [place]      PDMP not reviewed this encounter.   Stuart Vernell Norris, [place] 08/09/23 1740

## 2023-09-16 ENCOUNTER — Ambulatory Visit
Admission: RE | Admit: 2023-09-16 | Discharge: 2023-09-16 | Disposition: A | Discharge status: Home / Self Care | Payer: 59 | Source: Ambulatory Visit | Attending: Family Medicine | Admitting: Family Medicine

## 2023-09-16 ENCOUNTER — Ambulatory Visit (INDEPENDENT_AMBULATORY_CARE_PROVIDER_SITE_OTHER): Payer: 59

## 2023-09-16 VITALS — BP 117/65 | HR 80 | Temp 97.9°F | Resp 20

## 2023-09-16 DIAGNOSIS — R058 Other specified cough: Secondary | ICD-10-CM | POA: Diagnosis not present

## 2023-09-16 DIAGNOSIS — R053 Chronic cough: Secondary | ICD-10-CM | POA: Diagnosis not present

## 2023-09-16 DIAGNOSIS — J441 Chronic obstructive pulmonary disease with (acute) exacerbation: Secondary | ICD-10-CM | POA: Diagnosis not present

## 2023-09-16 MED ORDER — BUDESONIDE-FORMOTEROL FUMARATE 160-4.5 MCG/ACT IN AERO: 2.0000 | INHALATION_SPRAY | Freq: Two times a day (BID) | RESPIRATORY_TRACT | 1 refills | Status: AC

## 2023-09-16 MED ORDER — PREDNISONE 20 MG PO TABS: 40.0000 mg | ORAL_TABLET | Freq: Every day | ORAL | 0 refills | Status: DC

## 2023-09-16 NOTE — ED Triage Notes (Signed)
 Pt reports cough and congestion, pt states she was seen back on 01/10/205 for cough and congestion was prescribed antibiotics and prednisone, but it has not seemed to help with her sx's.

## 2023-09-16 NOTE — Discharge Instructions (Signed)
 In addition to the currently prescribed medications, you may use your albuterol inhaler as needed, continue Mucinex, continue drinking plenty of fluids.  It is important that you get a primary care provider to help manage your health care going forward.  You may go to the [facility] health website and click on the find a provider tab, this will list clinics accepting new patients and you can even schedule your appointment online.  Will call if anything comes back abnormal on your chest x-ray.

## 2023-09-16 NOTE — ED Provider Notes (Incomplete)
 [facility]    CSN: [id number] Arrival date & time: 09/16/23  1113      History   Chief Complaint Chief Complaint  Patient presents with   Cough    Congestion in chest - Entered by patient    HPI Julia Bean is a 58 y.o. female.   Patient presenting today following up on her visit from 08/09/2023 for cough and congestion.  She was given Zithromax, prednisone, albuterol at that time with no relief of symptoms.  She states she has not yet used her albuterol inhaler at all as she "has not needed it".  She continues to have a productive cough of yellow sputum that she states is worse the last 3 days.  Taking Mucinex with minimal relief.  Denies fever, chills, chest pain, shortness of breath, abdominal pain, vomiting, diarrhea.  Per chart review was diagnosed with COPD but does not have a primary care provider and is not on any ongoing inhaler therapy.   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) 01/29/2013   Past Surgical History:  Procedure Laterality Date   CESAREAN SECTION     KNEE ARTHROSCOPY Left 10/09/2021   Procedure: LEFT KNEE ARTHROSCOPY WITH PARTIAL AND LATERAL MENISECTOMY;  Surgeon: Gean Birchwood, MD;  Location: [facility];  Service: Orthopedics;  Laterality: Left;   KNEE SURGERY     OPEN REDUCTION INTERNAL FIXATION (ORIF) METACARPAL  07/25/2012   Procedure: OPEN REDUCTION INTERNAL FIXATION (ORIF) METACARPAL;  Surgeon: [name], MD;  Location: [facility];  Service: Orthopedics;  Laterality: Right;  Open Reduction Internal Fixation Right Third Finger   OPEN REDUCTION INTERNAL FIXATION (ORIF) METACARPAL Right 09/04/2012   Procedure: OPEN REDUCTION INTERNAL FIXATION (ORIF) METACARPAL;  Surgeon: [name], MD;  Location: [facility];  Service: Orthopedics;  Laterality: Right;   ORIF MIDDLE PHALANX FRACTURE AND DEBRIDEMENT    OB History   No obstetric history on file.     Home Medications    Prior to Admission medications   Medication Sig Start Date End Date Taking? Authorizing Provider  budesonide-formoterol (SYMBICORT) 160-4.5 MCG/ACT inhaler Inhale 2 puffs into the lungs 2 (two) times daily. 09/16/23  Yes Particia Nearing, PA-C  albuterol (VENTOLIN HFA) 108 (90 Base) MCG/ACT inhaler Inhale 2 puffs into the lungs every 4 (four) hours as needed. 08/09/23   Particia Nearing, PA-C  azithromycin (ZITHROMAX) 250 MG tablet Take first 2 tablets together, then 1 every day until finished. 08/09/23   Particia Nearing, PA-C  ibuprofen (ADVIL) 200 MG tablet Take 600-800 mg by mouth at bedtime as needed for moderate pain.    [provider]  predniSONE (DELTASONE) 20 MG tablet Take 2 tablets (40 mg total) by mouth daily with breakfast. 09/16/23   Particia Nearing, PA-C  promethazine-dextromethorphan (PROMETHAZINE-DM) 6.25-15 MG/5ML syrup Take 5 mLs by mouth 4 (four) times daily as needed. 08/09/23   Particia Nearing, PA-C    Family History Family History  Problem Relation Age of Onset   Diabetes Mother    Cancer Father     Social History Social History   Tobacco Use   Smoking status: Every Day    Current packs/day: 1.00    Average packs/day: 1 pack/day for 20.0 years (20.0 ttl pk-yrs)  Types: Cigarettes   Smokeless tobacco: Never  Vaping Use   Vaping status: Never Used  Substance Use Topics   Alcohol use: Yes    Comment: rare   Drug use: No     Allergies   Clindamycin/lincomycin   Review of Systems Review of Systems PER HPI  Physical Exam Triage Vital Signs ED Triage Vitals  Encounter Vitals Group     BP 09/16/23 1133 117/65     Systolic BP Percentile --      Diastolic BP Percentile --      Pulse Rate 09/16/23 1133 80     Resp 09/16/23 1133 20     Temp 09/16/23 1133 97.9 F (36.6 C)     Temp Source  09/16/23 1133 Oral     SpO2 09/16/23 1133 95 %     Weight --      Height --      Head Circumference --      Peak Flow --      Pain Score 09/16/23 1138 0     Pain Loc --      Pain Education --      Exclude from Growth Chart --    No data found.  Updated Vital Signs BP 117/65 (BP Location: Right Arm)   Pulse 80   Temp 97.9 F (36.6 C) (Oral)   Resp 20   LMP 10/15/2014   SpO2 95%   Visual Acuity Right Eye Distance:   Left Eye Distance:   Bilateral Distance:    Right Eye Near:   Left Eye Near:    Bilateral Near:     Physical Exam Vitals and nursing note reviewed.  Constitutional:      Appearance: Normal appearance.  HENT:     Head: Atraumatic.     Right Ear: Tympanic membrane and external ear normal.     Left Ear: Tympanic membrane and external ear normal.     Nose: Nose normal. No congestion or rhinorrhea.     Mouth/Throat:     Mouth: Mucous membranes are moist.     Pharynx: No posterior oropharyngeal erythema.  Eyes:     Extraocular Movements: Extraocular movements intact.     Conjunctiva/sclera: Conjunctivae normal.  Cardiovascular:     Rate and Rhythm: Normal rate and regular rhythm.     Heart sounds: Normal heart sounds.  Pulmonary:     Effort: Pulmonary effort is normal.     Breath sounds: No wheezing or rales.     Comments: Mild decreased breath sounds throughout Musculoskeletal:        General: Normal range of motion.     Cervical back: Normal range of motion and neck supple.  Skin:    General: Skin is warm and dry.  Neurological:     Mental Status: She is alert and oriented to person, place, and time.  Psychiatric:        Mood and Affect: Mood normal.        Thought Content: Thought content normal.      UC Treatments / Results  Labs (all labs ordered are listed, but only abnormal results are displayed) Labs Reviewed - No data to display  EKG   Radiology DG Chest 2 View Result Date: 09/16/2023 CLINICAL DATA:  Productive cough for 2-3  months. EXAM: CHEST - 2 VIEW COMPARISON:  None Available. FINDINGS: The heart size and mediastinal contours are within normal limits. Both lungs are clear. The visualized skeletal structures are unremarkable. IMPRESSION: No active cardiopulmonary disease.  Electronically Signed   By: Jeronimo Greaves M.D.   On: 09/16/2023 15:26    Procedures Procedures (including critical care time)  Medications Ordered in UC Medications - No data to display  Initial Impression / Assessment and Plan / UC Course  I have reviewed the triage vital signs and the nursing notes.  Pertinent labs & imaging results that were available during my care of the patient were reviewed by me and considered in my medical decision making (see chart for details).     Chest x-ray today without evidence of pneumonia or other acute findings.  Do suspect ongoing poorly controlled COPD to be causing her symptoms.  She would likely ultimately benefit from Spiriva or other anticholinergic maintenance inhaler but for now we will start Symbicort, highly recommended use of her albuterol inhaler for coughing fits and chest tightness and prednisone prescribed additionally.  Other supportive medications and home care reviewed.  Return for any worsening symptoms.  Resources given for PCP establish care visit  Final Clinical Impressions(s) / UC Diagnoses   Final diagnoses:  Chronic cough  COPD exacerbation (HCC)     Discharge Instructions      In addition to the currently prescribed medications, you may use your albuterol inhaler as needed, continue Mucinex, continue drinking plenty of fluids.  It is important that you get a primary care provider to help manage your health care going forward.  You may go to the [facility] health website and click on the find a provider tab, this will list clinics accepting new patients and you can even schedule your appointment online.  Will call if anything comes back abnormal on your chest x-ray.     ED  Prescriptions     Medication Sig Dispense Auth. Provider   predniSONE (DELTASONE) 20 MG tablet Take 2 tablets (40 mg total) by mouth daily with breakfast. 10 tablet Particia Nearing, PA-C   budesonide-formoterol [facility]) 160-4.5 MCG/ACT inhaler Inhale 2 puffs into the lungs 2 (two) times daily. 1 each Particia Nearing, PA-C      PDMP not reviewed this encounter.   Particia Nearing, [place] 09/17/23 1841    Particia Nearing, PA-C 09/17/23 1842

## 2023-10-10 DIAGNOSIS — M1712 Unilateral primary osteoarthritis, left knee: Secondary | ICD-10-CM | POA: Diagnosis not present

## 2024-01-10 DIAGNOSIS — Z23 Encounter for immunization: Secondary | ICD-10-CM | POA: Diagnosis not present

## 2024-01-10 DIAGNOSIS — M79671 Pain in right foot: Secondary | ICD-10-CM | POA: Diagnosis not present

## 2024-01-10 DIAGNOSIS — S99921A Unspecified injury of right foot, initial encounter: Secondary | ICD-10-CM | POA: Diagnosis not present

## 2024-01-10 DIAGNOSIS — S99911A Unspecified injury of right ankle, initial encounter: Secondary | ICD-10-CM | POA: Diagnosis not present

## 2024-02-12 DIAGNOSIS — Z0000 Encounter for general adult medical examination without abnormal findings: Secondary | ICD-10-CM | POA: Diagnosis not present

## 2024-02-12 DIAGNOSIS — Z716 Tobacco abuse counseling: Secondary | ICD-10-CM | POA: Diagnosis not present

## 2024-02-12 DIAGNOSIS — R053 Chronic cough: Secondary | ICD-10-CM | POA: Diagnosis not present

## 2024-02-12 DIAGNOSIS — H00013 Hordeolum externum right eye, unspecified eyelid: Secondary | ICD-10-CM | POA: Diagnosis not present

## 2024-02-12 DIAGNOSIS — Z1329 Encounter for screening for other suspected endocrine disorder: Secondary | ICD-10-CM | POA: Diagnosis not present

## 2024-02-12 DIAGNOSIS — Z13220 Encounter for screening for lipoid disorders: Secondary | ICD-10-CM | POA: Diagnosis not present

## 2024-02-17 ENCOUNTER — Other Ambulatory Visit: Payer: Self-pay | Admitting: Nurse Practitioner

## 2024-02-17 DIAGNOSIS — R053 Chronic cough: Secondary | ICD-10-CM

## 2024-02-26 ENCOUNTER — Ambulatory Visit

## 2024-02-26 DIAGNOSIS — F17210 Nicotine dependence, cigarettes, uncomplicated: Secondary | ICD-10-CM | POA: Diagnosis not present

## 2024-02-26 DIAGNOSIS — R918 Other nonspecific abnormal finding of lung field: Secondary | ICD-10-CM | POA: Diagnosis not present

## 2024-02-26 DIAGNOSIS — I700 Atherosclerosis of aorta: Secondary | ICD-10-CM | POA: Diagnosis not present

## 2024-02-26 DIAGNOSIS — R059 Cough, unspecified: Secondary | ICD-10-CM

## 2024-02-26 DIAGNOSIS — R053 Chronic cough: Secondary | ICD-10-CM

## 2024-03-11 ENCOUNTER — Encounter: Admitting: Advanced Practice Midwife

## 2024-04-13 ENCOUNTER — Other Ambulatory Visit (HOSPITAL_COMMUNITY): Payer: Self-pay | Admitting: Nurse Practitioner

## 2024-04-13 DIAGNOSIS — Z1231 Encounter for screening mammogram for malignant neoplasm of breast: Secondary | ICD-10-CM

## 2024-04-14 DIAGNOSIS — B079 Viral wart, unspecified: Secondary | ICD-10-CM | POA: Diagnosis not present

## 2024-04-14 DIAGNOSIS — J0140 Acute pansinusitis, unspecified: Secondary | ICD-10-CM | POA: Diagnosis not present

## 2024-04-22 ENCOUNTER — Encounter: Admitting: Obstetrics & Gynecology

## 2024-04-22 ENCOUNTER — Ambulatory Visit (HOSPITAL_COMMUNITY)
Admission: RE | Admit: 2024-04-22 | Discharge: 2024-04-22 | Disposition: A | Discharge status: Home / Self Care | Source: Ambulatory Visit | Attending: Nurse Practitioner | Admitting: Nurse Practitioner

## 2024-04-22 ENCOUNTER — Encounter (HOSPITAL_COMMUNITY): Payer: Self-pay

## 2024-04-22 DIAGNOSIS — Z1231 Encounter for screening mammogram for malignant neoplasm of breast: Secondary | ICD-10-CM | POA: Diagnosis not present

## 2024-04-23 ENCOUNTER — Encounter (HOSPITAL_COMMUNITY): Payer: Self-pay | Admitting: Nurse Practitioner

## 2024-04-27 ENCOUNTER — Encounter (HOSPITAL_COMMUNITY): Payer: Self-pay | Admitting: Nurse Practitioner

## 2024-04-28 ENCOUNTER — Other Ambulatory Visit (HOSPITAL_COMMUNITY): Payer: Self-pay | Admitting: Nurse Practitioner

## 2024-04-28 DIAGNOSIS — R928 Other abnormal and inconclusive findings on diagnostic imaging of breast: Secondary | ICD-10-CM

## 2024-05-06 DIAGNOSIS — Z716 Tobacco abuse counseling: Secondary | ICD-10-CM | POA: Diagnosis not present

## 2024-05-06 DIAGNOSIS — W540XXA Bitten by dog, initial encounter: Secondary | ICD-10-CM | POA: Diagnosis not present

## 2024-05-06 DIAGNOSIS — L0390 Cellulitis, unspecified: Secondary | ICD-10-CM | POA: Diagnosis not present

## 2024-05-12 ENCOUNTER — Ambulatory Visit (HOSPITAL_COMMUNITY)
Admission: RE | Admit: 2024-05-12 | Discharge: 2024-05-12 | Disposition: A | Discharge status: Home / Self Care | Source: Ambulatory Visit | Attending: Nurse Practitioner | Admitting: Nurse Practitioner

## 2024-05-12 DIAGNOSIS — R928 Other abnormal and inconclusive findings on diagnostic imaging of breast: Secondary | ICD-10-CM

## 2024-05-12 DIAGNOSIS — R92321 Mammographic fibroglandular density, right breast: Secondary | ICD-10-CM | POA: Diagnosis not present

## 2024-05-12 DIAGNOSIS — N6310 Unspecified lump in the right breast, unspecified quadrant: Secondary | ICD-10-CM | POA: Diagnosis not present

## 2024-06-03 ENCOUNTER — Encounter: Payer: Self-pay | Admitting: Obstetrics & Gynecology

## 2024-06-03 ENCOUNTER — Other Ambulatory Visit (HOSPITAL_COMMUNITY)
Admission: RE | Admit: 2024-06-03 | Discharge: 2024-06-03 | Disposition: A | Discharge status: Home / Self Care | Source: Ambulatory Visit | Attending: Obstetrics & Gynecology | Admitting: Obstetrics & Gynecology

## 2024-06-03 ENCOUNTER — Ambulatory Visit (INDEPENDENT_AMBULATORY_CARE_PROVIDER_SITE_OTHER): Admitting: Obstetrics & Gynecology

## 2024-06-03 VITALS — BP 112/71 | HR 80 | Ht 68.5 in | Wt 216.8 lb

## 2024-06-03 DIAGNOSIS — Z01419 Encounter for gynecological examination (general) (routine) without abnormal findings: Secondary | ICD-10-CM | POA: Diagnosis not present

## 2024-06-03 DIAGNOSIS — Z1151 Encounter for screening for human papillomavirus (HPV): Secondary | ICD-10-CM

## 2024-06-03 DIAGNOSIS — Z1331 Encounter for screening for depression: Secondary | ICD-10-CM | POA: Diagnosis not present

## 2024-06-03 NOTE — Addendum Note (Signed)
 Addended by: ILEAN RUTHERFORD HERO on: 06/03/2024 10:46 AM   Modules accepted: Orders

## 2024-06-03 NOTE — Progress Notes (Signed)
 WELL-WOMAN EXAMINATION Patient name: Julia Bean MRN [id number]  Date of birth: April 24, 1966 Chief Complaint:   Annual Exam  History of Present Illness:   Julia Bean is a 58 y.o. [phone] PM female being seen today for a routine well-woman exam.   Patient denies vaginal bleeding, discharge, itching or irritation.  Denies pelvic pain.  Reports no urinary concerns.  Not sexually active  Recent visit with PCP noted to have elevated cholesterol, but interested in more information before starting a medication.  Encourage patient to reach back out to PCP.  Notes significant weight gain which she thinks is likely due to a change in her job and when she eats dinner.  Wants to try to make lifestyle and nutritional changes before starting a medication    Patient's last menstrual period was 10/15/2014.  The current method of family planning is abstinence.    Last pap collected today.  Last mammogram: 04/2024. Last colonoscopy: cologuard completed 2025     06/03/2024    9:31 AM 09/23/2015    2:26 PM 04/28/2015   11:00 AM 03/26/2014   11:10 AM 01/29/2013   11:25 AM  Depression screen PHQ 2/9  Decreased Interest 0 0 0 0 0  Down, Depressed, Hopeless 0 0 0 0 0  PHQ - 2 Score 0 0 0 0 0  Altered sleeping 0      Tired, decreased energy 1      Change in appetite 0      Feeling bad or failure about yourself  0      Trouble concentrating 0      Moving slowly or fidgety/restless 0      Suicidal thoughts 0      PHQ-9 Score 1          Review of Systems:   Pertinent items are noted in HPI Denies any headaches, blurred vision, fatigue, shortness of breath, chest pain, abdominal pain, bowel movements, urination, or intercourse unless otherwise stated above.  Pertinent History Reviewed:  Reviewed past medical,surgical, social and family history.  Reviewed problem list, medications and allergies. Physical Assessment:   Vitals:   06/03/24 0913  BP: 112/71  Pulse: 80  Weight: 216 lb  12.8 oz (98.3 kg)  Height: 5' 8.5 (1.74 m)  Body mass index is 32.48 kg/m.        Physical Examination:   General appearance - well appearing, and in no distress  Mental status - alert, oriented to person, place, and time  Psych:  She has a normal mood and affect  Skin - warm and dry, normal color, no suspicious lesions noted  Chest - effort normal, all lung fields clear to auscultation bilaterally  Heart - normal rate and regular rhythm  Neck:  midline trachea, no thyromegaly or nodules  Breasts - breasts appear normal-dense tissue noted no suspicious masses, no skin or nipple changes or  axillary nodes  Abdomen - soft, nontender, nondistended, no masses or organomegaly  Pelvic - VULVA: normal appearing vulva with no masses, tenderness or lesions  VAGINA: normal appearing vagina with normal color and discharge, no lesions  CERVIX: normal appearing cervix without discharge or lesions, no CMT  Thin prep pap is done with HR HPV cotesting  UTERUS: uterus is felt to be normal size, shape, consistency and nontender   ADNEXA: No adnexal masses or tenderness noted.  Extremities:  No swelling or varicosities noted  Chaperone: Rutherford Rover     Assessment & Plan:  1) Well-Woman Exam -  Pap collected, reviewed ASCCP guidelines - Mammogram up-to-date - Per patient Cologuard completed this year   No orders of the defined types were placed in this encounter.   Meds: No orders of the defined types were placed in this encounter.   Follow-up: Return in about 1 year (around 06/03/2025) for Annual.   Hani Patnode, DO Attending Obstetrician & Gynecologist, [facility] for [facility], [facility]

## 2024-06-08 LAB — CYTOLOGY - PAP
Adequacy: ABSENT
Comment: NEGATIVE
Diagnosis: NEGATIVE
High risk HPV: NEGATIVE

## 2024-06-09 ENCOUNTER — Ambulatory Visit: Payer: Self-pay | Admitting: Obstetrics & Gynecology

## 2024-07-24 ENCOUNTER — Ambulatory Visit
Admission: EM | Admit: 2024-07-24 | Discharge: 2024-07-24 | Disposition: A | Discharge status: Home / Self Care | Attending: Family Medicine | Admitting: Family Medicine

## 2024-07-24 ENCOUNTER — Encounter: Payer: Self-pay | Admitting: Emergency Medicine

## 2024-07-24 DIAGNOSIS — F17210 Nicotine dependence, cigarettes, uncomplicated: Secondary | ICD-10-CM

## 2024-07-24 DIAGNOSIS — R062 Wheezing: Secondary | ICD-10-CM

## 2024-07-24 DIAGNOSIS — J22 Unspecified acute lower respiratory infection: Secondary | ICD-10-CM

## 2024-07-24 MED ORDER — AZELASTINE HCL 0.1 % NA SOLN: 1.0000 | Freq: Two times a day (BID) | NASAL | 0 refills | Status: AC

## 2024-07-24 MED ORDER — PROMETHAZINE-DM 6.25-15 MG/5ML PO SYRP: 5.0000 mL | ORAL_SOLUTION | Freq: Four times a day (QID) | ORAL | 0 refills | Status: AC | PRN

## 2024-07-24 MED ORDER — AZITHROMYCIN 250 MG PO TABS: ORAL_TABLET | ORAL | 0 refills | Status: AC

## 2024-07-24 MED ORDER — PREDNISONE 20 MG PO TABS: 40.0000 mg | ORAL_TABLET | Freq: Every day | ORAL | 0 refills | Status: AC

## 2024-07-24 NOTE — Discharge Instructions (Signed)
 In addition to the prescribed medications, you may take over-the-counter cold and congestion medications, use saline sinus rinses, humidifiers.  Follow-up for worsening or unresolving symptoms.

## 2024-07-24 NOTE — ED Provider Notes (Signed)
 " [facility]    CSN: [id number] Arrival date & time: 07/24/24  1103      History   Chief Complaint No chief complaint on file.   HPI Julia Bean is a 58 y.o. female.   Patient presenting today with 2-week history of productive cough, fever, body aches, wheezing, shortness of breath, fatigue.  Denies chest pain, abdominal pain, vomiting, diarrhea.  So far try Mucinex with minimal relief.  Denies any chronic pulmonary disease but is a chronic smoker and has had to have steroid and albuterol  inhalers in the past she states during illnesses.    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    Past Surgical History:  Procedure Laterality Date   CESAREAN SECTION     CRYOTHERAPY     KNEE ARTHROSCOPY Left 10/09/2021   Procedure: LEFT KNEE ARTHROSCOPY WITH PARTIAL AND LATERAL MENISECTOMY;  Surgeon: Liam Lerner, MD;  Location: [facility];  Service: Orthopedics;  Laterality: Left;   KNEE SURGERY     OPEN REDUCTION INTERNAL FIXATION (ORIF) METACARPAL  07/25/2012   Procedure: OPEN REDUCTION INTERNAL FIXATION (ORIF) METACARPAL;  Surgeon: Prentice LELON Pagan, MD;  Location: [facility];  Service: Orthopedics;  Laterality: Right;  Open Reduction Internal Fixation Right Third Finger   OPEN REDUCTION INTERNAL FIXATION (ORIF) METACARPAL Right 09/04/2012   Procedure: OPEN REDUCTION INTERNAL FIXATION (ORIF) METACARPAL;  Surgeon: Prentice LELON Pagan, MD;  Location: [facility];  Service: Orthopedics;  Laterality: Right;  ORIF MIDDLE PHALANX FRACTURE AND DEBRIDEMENT    OB History     Gravida  2   Para  1   Term  1   Preterm      AB  1   Living  2      SAB      IAB  1   Ectopic      Multiple  1   Live Births               Home Medications     Prior to Admission medications  Medication Sig Start Date End Date Taking? Authorizing Provider  azelastine  (ASTELIN ) 0.1 % nasal spray Place 1 spray into both nostrils 2 (two) times daily. Use in each nostril as directed 07/24/24  Yes Stuart Vernell Norris, PA-C  azithromycin  (ZITHROMAX ) 250 MG tablet Take first 2 tablets together, then 1 every day until finished. 07/24/24  Yes Stuart Vernell Norris, PA-C  predniSONE  (DELTASONE ) 20 MG tablet Take 2 tablets (40 mg total) by mouth daily with breakfast. 07/24/24  Yes Stuart Vernell Norris, PA-C  promethazine -dextromethorphan (PROMETHAZINE -DM) 6.25-15 MG/5ML syrup Take 5 mLs by mouth 4 (four) times daily as needed. 07/24/24  Yes Stuart Vernell Norris, PA-C  albuterol  (VENTOLIN  HFA) 108 [phone] Base) MCG/ACT inhaler Inhale 2 puffs into the lungs every 4 (four) hours as needed. Patient not taking: Reported on 06/03/2024 08/09/23   Stuart Vernell Norris, PA-C  budesonide -formoterol  (SYMBICORT ) 160-4.5 MCG/ACT inhaler Inhale 2 puffs into the lungs 2 (two) times daily. Patient not taking: Reported on 06/03/2024 09/16/23   Stuart Vernell Norris, PA-C  ibuprofen  (ADVIL ) 200 MG tablet Take 600-800 mg by mouth at bedtime as needed for moderate pain.    [provider]  Multiple Vitamin (MULTIVITAMIN)  tablet Take 1 tablet by mouth daily.    [provider]    Family History Family History  Problem Relation Age of Onset   Stroke Maternal Grandmother    Cancer Father        leukemia   Diabetes Mother    Heart attack Maternal Uncle     Social History Social History[1]   Allergies   Clindamycin/lincomycin   Review of Systems Review of Systems PER HPI  Physical Exam Triage Vital Signs ED Triage Vitals  Encounter Vitals Group     BP 07/24/24 1130 111/73     Girls Systolic BP Percentile --      Girls Diastolic BP Percentile --      Boys Systolic BP Percentile --      Boys Diastolic BP Percentile --      Pulse Rate  07/24/24 1130 77     Resp 07/24/24 1130 16     Temp 07/24/24 1130 98.8 F (37.1 C)     Temp Source 07/24/24 1130 Oral     SpO2 07/24/24 1131 92 %     Weight --      Height --      Head Circumference --      Peak Flow --      Pain Score 07/24/24 1132 0     Pain Loc --      Pain Education --      Exclude from Growth Chart --    No data found.  Updated Vital Signs BP 111/73 (BP Location: Right Arm)   Pulse 77   Temp 98.8 F (37.1 C) (Oral)   Resp 16   LMP 10/15/2014   SpO2 92%   Visual Acuity Right Eye Distance:   Left Eye Distance:   Bilateral Distance:    Right Eye Near:   Left Eye Near:    Bilateral Near:     Physical Exam Vitals and nursing note reviewed.  Constitutional:      Appearance: Normal appearance.  HENT:     Head: Atraumatic.     Right Ear: Tympanic membrane and external ear normal.     Left Ear: Tympanic membrane and external ear normal.     Nose: Congestion present.     Mouth/Throat:     Mouth: Mucous membranes are moist.     Pharynx: Posterior oropharyngeal erythema present.  Eyes:     Extraocular Movements: Extraocular movements intact.     Conjunctiva/sclera: Conjunctivae normal.  Cardiovascular:     Rate and Rhythm: Normal rate and regular rhythm.     Heart sounds: Normal heart sounds.  Pulmonary:     Effort: Pulmonary effort is normal.     Breath sounds: Wheezing and rales present.  Musculoskeletal:        General: Normal range of motion.     Cervical back: Normal range of motion and neck supple.  Skin:    General: Skin is warm and dry.  Neurological:     Mental Status: She is alert and oriented to person, place, and time.  Psychiatric:        Mood and Affect: Mood normal.        Thought Content: Thought content normal.    UC Treatments / Results  Labs (all labs ordered are listed, but only abnormal results are displayed) Labs Reviewed - No data to display  EKG   Radiology No results found.  Procedures Procedures  (including critical care time)  Medications Ordered in UC Medications -  No data to display  Initial Impression / Assessment and Plan / UC Course  I have reviewed the triage vital signs and the nursing notes.  Pertinent labs & imaging results that were available during my care of the patient were reviewed by me and considered in my medical decision making (see chart for details).     Duration and worsening course of symptoms, will treat for lower respiratory infection with Zithromax , Phenergan  DM, Astelin  and prednisone  for wheezing.  Suspect some underlying COPD given chronic smoking and lung sounds.  Supportive over-the-counter medications, home care.  Return for worsening or unresolving symptoms.  Final Clinical Impressions(s) / UC Diagnoses   Final diagnoses:  Lower respiratory infection  Wheezing  Cigarette smoker     Discharge Instructions      In addition to the prescribed medications, you may take over-the-counter cold and congestion medications, use saline sinus rinses, humidifiers.  Follow-up for worsening or unresolving symptoms.    ED Prescriptions     Medication Sig Dispense Auth. Provider   predniSONE  (DELTASONE ) 20 MG tablet Take 2 tablets (40 mg total) by mouth daily with breakfast. 10 tablet Stuart Vernell Norris, PA-C   azithromycin  (ZITHROMAX ) 250 MG tablet Take first 2 tablets together, then 1 every day until finished. 6 tablet Stuart Vernell Norris, PA-C   promethazine -dextromethorphan (PROMETHAZINE -DM) 6.25-15 MG/5ML syrup Take 5 mLs by mouth 4 (four) times daily as needed. 100 mL Stuart Vernell Norris, PA-C   azelastine  (ASTELIN ) 0.1 % nasal spray Place 1 spray into both nostrils 2 (two) times daily. Use in each nostril as directed 30 mL Stuart Vernell Norris, PA-C      PDMP not reviewed this encounter.    [1]  Social History Tobacco Use   Smoking status: Every Day    Current packs/day: 1.00    Average packs/day: 1 pack/day for 20.0 years  (20.0 ttl pk-yrs)    Types: Cigarettes   Smokeless tobacco: Never  Vaping Use   Vaping status: Never Used  Substance Use Topics   Alcohol use: Yes    Comment: rare   Drug use: No     Stuart Vernell Norris, PA-C 07/24/24 1414  "

## 2024-07-24 NOTE — ED Triage Notes (Signed)
 Cough x 2 weeks.  Fever on Monday and cough productive and body aches.  States body aches and headache has went away.  Has nasal and chest congestion.
